# Patient Record
Sex: Male | Born: 1965 | Race: White | Hispanic: No | Marital: Married | Smoking: Current every day smoker
Health system: Southern US, Community
[De-identification: ages and names within clinical notes are randomized; demographics above are authoritative.]

## PROBLEM LIST (undated history)

## (undated) DIAGNOSIS — R202 Paresthesia of skin: Secondary | ICD-10-CM

## (undated) DIAGNOSIS — R262 Difficulty in walking, not elsewhere classified: Secondary | ICD-10-CM

## (undated) DIAGNOSIS — R200 Anesthesia of skin: Secondary | ICD-10-CM

## (undated) DIAGNOSIS — F419 Anxiety disorder, unspecified: Secondary | ICD-10-CM

## (undated) DIAGNOSIS — R531 Weakness: Secondary | ICD-10-CM

## (undated) DIAGNOSIS — F329 Major depressive disorder, single episode, unspecified: Secondary | ICD-10-CM

## (undated) DIAGNOSIS — F32A Depression, unspecified: Secondary | ICD-10-CM

## (undated) HISTORY — DX: Depression, unspecified: F32.A

## (undated) HISTORY — DX: Weakness: R53.1

## (undated) HISTORY — DX: Anxiety disorder, unspecified: F41.9

## (undated) HISTORY — DX: Anesthesia of skin: R20.0

## (undated) HISTORY — DX: Major depressive disorder, single episode, unspecified: F32.9

## (undated) HISTORY — DX: Paresthesia of skin: R20.2

## (undated) HISTORY — DX: Difficulty in walking, not elsewhere classified: R26.2

## (undated) HISTORY — PX: OTHER SURGICAL HISTORY: SHX169

---

## 2002-11-28 ENCOUNTER — Encounter: Payer: Self-pay | Admitting: Emergency Medicine

## 2002-11-28 ENCOUNTER — Emergency Department (HOSPITAL_COMMUNITY): Admission: EM | Admit: 2002-11-28 | Discharge: 2002-11-28 | Payer: Self-pay | Admitting: Emergency Medicine

## 2004-06-15 ENCOUNTER — Encounter: Admission: RE | Admit: 2004-06-15 | Discharge: 2004-06-15 | Payer: Self-pay | Admitting: Family Medicine

## 2004-07-05 ENCOUNTER — Encounter: Admission: RE | Admit: 2004-07-05 | Discharge: 2004-07-05 | Payer: Self-pay | Admitting: Family Medicine

## 2004-07-22 ENCOUNTER — Encounter: Admission: RE | Admit: 2004-07-22 | Discharge: 2004-07-22 | Payer: Self-pay | Admitting: Family Medicine

## 2004-08-02 ENCOUNTER — Encounter: Admission: RE | Admit: 2004-08-02 | Discharge: 2004-08-02 | Payer: Self-pay | Admitting: Family Medicine

## 2006-06-21 ENCOUNTER — Emergency Department (HOSPITAL_COMMUNITY): Admission: EM | Admit: 2006-06-21 | Discharge: 2006-06-21 | Payer: Self-pay | Admitting: Emergency Medicine

## 2008-01-23 ENCOUNTER — Ambulatory Visit: Payer: Self-pay | Admitting: Cardiovascular Disease

## 2008-01-27 ENCOUNTER — Ambulatory Visit: Payer: Self-pay

## 2008-11-09 ENCOUNTER — Emergency Department (HOSPITAL_COMMUNITY): Admission: EM | Admit: 2008-11-09 | Discharge: 2008-11-09 | Payer: Self-pay | Admitting: Emergency Medicine

## 2008-11-11 ENCOUNTER — Emergency Department (HOSPITAL_COMMUNITY): Admission: EM | Admit: 2008-11-11 | Discharge: 2008-11-11 | Payer: Self-pay | Admitting: Internal Medicine

## 2008-11-12 ENCOUNTER — Encounter: Payer: Self-pay | Admitting: Emergency Medicine

## 2008-11-12 ENCOUNTER — Ambulatory Visit: Payer: Self-pay | Admitting: Internal Medicine

## 2008-11-12 ENCOUNTER — Ambulatory Visit: Payer: Self-pay | Admitting: Critical Care Medicine

## 2008-11-12 ENCOUNTER — Inpatient Hospital Stay (HOSPITAL_COMMUNITY): Admission: EM | Admit: 2008-11-12 | Discharge: 2008-12-01 | Payer: Self-pay | Admitting: Neurology

## 2008-11-17 ENCOUNTER — Encounter: Payer: Self-pay | Admitting: Emergency Medicine

## 2008-11-19 ENCOUNTER — Ambulatory Visit: Payer: Self-pay | Admitting: Internal Medicine

## 2008-11-20 ENCOUNTER — Encounter (INDEPENDENT_AMBULATORY_CARE_PROVIDER_SITE_OTHER): Payer: Self-pay | Admitting: Neurology

## 2008-11-20 ENCOUNTER — Ambulatory Visit: Payer: Self-pay | Admitting: Infectious Diseases

## 2008-11-24 ENCOUNTER — Encounter: Payer: Self-pay | Admitting: Internal Medicine

## 2008-12-01 ENCOUNTER — Ambulatory Visit: Payer: Self-pay | Admitting: Pulmonary Disease

## 2008-12-01 ENCOUNTER — Inpatient Hospital Stay: Admission: RE | Admit: 2008-12-01 | Discharge: 2009-03-01 | Payer: Self-pay | Admitting: Internal Medicine

## 2008-12-14 ENCOUNTER — Encounter: Payer: Self-pay | Admitting: Critical Care Medicine

## 2008-12-30 ENCOUNTER — Ambulatory Visit: Payer: Self-pay | Admitting: Pulmonary Disease

## 2009-01-27 ENCOUNTER — Ambulatory Visit: Payer: Self-pay | Admitting: Pulmonary Disease

## 2009-03-17 ENCOUNTER — Inpatient Hospital Stay (HOSPITAL_COMMUNITY): Admission: EM | Admit: 2009-03-17 | Discharge: 2009-03-23 | Payer: Self-pay | Admitting: Emergency Medicine

## 2009-03-19 ENCOUNTER — Ambulatory Visit: Payer: Self-pay | Admitting: Physical Medicine & Rehabilitation

## 2009-03-23 ENCOUNTER — Inpatient Hospital Stay (HOSPITAL_COMMUNITY)
Admission: RE | Admit: 2009-03-23 | Discharge: 2009-04-27 | Payer: Self-pay | Admitting: Physical Medicine & Rehabilitation

## 2009-03-23 ENCOUNTER — Ambulatory Visit: Payer: Self-pay | Admitting: Physical Medicine & Rehabilitation

## 2009-04-20 ENCOUNTER — Ambulatory Visit: Payer: Self-pay | Admitting: Psychology

## 2009-04-24 ENCOUNTER — Ambulatory Visit: Payer: Self-pay | Admitting: Physical Medicine & Rehabilitation

## 2009-05-25 ENCOUNTER — Encounter
Admission: RE | Admit: 2009-05-25 | Discharge: 2009-07-01 | Payer: Self-pay | Admitting: Physical Medicine & Rehabilitation

## 2009-05-26 ENCOUNTER — Ambulatory Visit: Payer: Self-pay | Admitting: Physical Medicine & Rehabilitation

## 2009-06-22 ENCOUNTER — Encounter: Admission: RE | Admit: 2009-06-22 | Discharge: 2009-06-22 | Payer: Self-pay | Admitting: Internal Medicine

## 2009-06-28 ENCOUNTER — Ambulatory Visit: Payer: Self-pay | Admitting: Physical Medicine & Rehabilitation

## 2009-07-13 ENCOUNTER — Encounter
Admission: RE | Admit: 2009-07-13 | Discharge: 2009-10-11 | Payer: Self-pay | Admitting: Physical Medicine & Rehabilitation

## 2009-07-27 ENCOUNTER — Encounter
Admission: RE | Admit: 2009-07-27 | Discharge: 2009-10-25 | Payer: Self-pay | Admitting: Physical Medicine & Rehabilitation

## 2009-09-28 ENCOUNTER — Ambulatory Visit: Payer: Self-pay | Admitting: Physical Medicine & Rehabilitation

## 2009-10-25 ENCOUNTER — Encounter
Admission: RE | Admit: 2009-10-25 | Discharge: 2010-01-23 | Payer: Self-pay | Admitting: Physical Medicine & Rehabilitation

## 2009-10-28 ENCOUNTER — Ambulatory Visit: Payer: Self-pay | Admitting: Physical Medicine & Rehabilitation

## 2009-11-25 ENCOUNTER — Ambulatory Visit: Payer: Self-pay | Admitting: Physical Medicine & Rehabilitation

## 2010-01-25 ENCOUNTER — Encounter
Admission: RE | Admit: 2010-01-25 | Discharge: 2010-04-25 | Payer: Self-pay | Admitting: Physical Medicine & Rehabilitation

## 2010-01-26 ENCOUNTER — Telehealth (INDEPENDENT_AMBULATORY_CARE_PROVIDER_SITE_OTHER): Payer: Self-pay | Admitting: *Deleted

## 2010-01-28 ENCOUNTER — Ambulatory Visit (HOSPITAL_COMMUNITY): Admission: RE | Admit: 2010-01-28 | Discharge: 2010-01-28 | Payer: Self-pay | Admitting: Orthopedic Surgery

## 2010-02-02 ENCOUNTER — Ambulatory Visit: Payer: Self-pay | Admitting: Physical Medicine & Rehabilitation

## 2010-03-03 ENCOUNTER — Ambulatory Visit: Payer: Self-pay | Admitting: Physical Medicine & Rehabilitation

## 2010-04-05 ENCOUNTER — Ambulatory Visit: Payer: Self-pay | Admitting: Physical Medicine & Rehabilitation

## 2010-05-20 ENCOUNTER — Encounter
Admission: RE | Admit: 2010-05-20 | Discharge: 2010-06-23 | Payer: Self-pay | Source: Home / Self Care | Attending: Physical Medicine & Rehabilitation | Admitting: Physical Medicine & Rehabilitation

## 2010-05-30 ENCOUNTER — Ambulatory Visit: Payer: Self-pay | Admitting: Physical Medicine & Rehabilitation

## 2010-06-23 ENCOUNTER — Ambulatory Visit: Payer: Self-pay | Admitting: Physical Medicine & Rehabilitation

## 2010-07-19 ENCOUNTER — Encounter
Admission: RE | Admit: 2010-07-19 | Discharge: 2010-07-21 | Payer: Self-pay | Source: Home / Self Care | Attending: Physical Medicine & Rehabilitation | Admitting: Physical Medicine & Rehabilitation

## 2010-07-21 ENCOUNTER — Ambulatory Visit
Admission: RE | Admit: 2010-07-21 | Discharge: 2010-07-21 | Payer: Self-pay | Source: Home / Self Care | Attending: Physical Medicine & Rehabilitation | Admitting: Physical Medicine & Rehabilitation

## 2010-08-09 NOTE — Progress Notes (Signed)
  Phone Note From Other Clinic   Caller: Janice/[facility] Initial call taken by: Colonel Bald to [phone] [facility]  January 26, 2010 3:35 PM

## 2010-09-16 ENCOUNTER — Encounter: Payer: Self-pay | Attending: Physical Medicine & Rehabilitation

## 2010-09-16 ENCOUNTER — Ambulatory Visit (HOSPITAL_BASED_OUTPATIENT_CLINIC_OR_DEPARTMENT_OTHER): Payer: Self-pay | Admitting: Physical Medicine & Rehabilitation

## 2010-09-16 DIAGNOSIS — G8250 Quadriplegia, unspecified: Secondary | ICD-10-CM

## 2010-09-16 DIAGNOSIS — G610 Guillain-Barre syndrome: Secondary | ICD-10-CM | POA: Insufficient documentation

## 2010-09-16 DIAGNOSIS — IMO0002 Reserved for concepts with insufficient information to code with codable children: Secondary | ICD-10-CM

## 2010-09-16 DIAGNOSIS — G894 Chronic pain syndrome: Secondary | ICD-10-CM | POA: Insufficient documentation

## 2010-09-16 DIAGNOSIS — F329 Major depressive disorder, single episode, unspecified: Secondary | ICD-10-CM

## 2010-09-24 LAB — CBC
MCH: 29.5 pg (ref 26.0–34.0)
MCHC: 34 g/dL (ref 30.0–36.0)
Platelets: 173 10*3/uL (ref 150–400)
RBC: 5.51 MIL/uL (ref 4.22–5.81)

## 2010-09-24 LAB — COMPREHENSIVE METABOLIC PANEL
AST: 21 U/L (ref 0–37)
Albumin: 4.3 g/dL (ref 3.5–5.2)
Calcium: 9.6 mg/dL (ref 8.4–10.5)
Creatinine, Ser: 0.87 mg/dL (ref 0.4–1.5)
GFR calc Af Amer: >60 mL/min (ref >60)
Sodium: 136 mEq/L (ref 135–145)

## 2010-10-13 LAB — URINE CULTURE: Colony Count: >=100000

## 2010-10-13 LAB — URINALYSIS, ROUTINE W REFLEX MICROSCOPIC
Bilirubin Urine: NEGATIVE
Specific Gravity, Urine: 1.026 (ref 1.005–1.030)
pH: 7 (ref 5.0–8.0)

## 2010-10-13 LAB — CBC
HCT: 36.3 % — ABNORMAL LOW (ref 39.0–52.0)
MCV: 86.8 fL (ref 78.0–100.0)
Platelets: 165 10*3/uL (ref 150–400)
RDW: 14.4 % (ref 11.5–15.5)

## 2010-10-13 LAB — URINE MICROSCOPIC-ADD ON

## 2010-10-13 LAB — BASIC METABOLIC PANEL
CO2: 27 mEq/L (ref 19–32)
Calcium: 9.7 mg/dL (ref 8.4–10.5)
Glucose, Bld: 150 mg/dL — ABNORMAL HIGH (ref 70–99)
Potassium: 4.2 mEq/L (ref 3.5–5.1)
Sodium: 140 mEq/L (ref 135–145)

## 2010-10-14 LAB — DIFFERENTIAL
Basophils Absolute: 0 10*3/uL (ref 0.0–0.1)
Basophils Absolute: 0 10*3/uL (ref 0.0–0.1)
Basophils Relative: 0 % (ref 0–1)
Eosinophils Absolute: 0.6 10*3/uL (ref 0.0–0.7)
Eosinophils Absolute: 0.6 10*3/uL (ref 0.0–0.7)
Eosinophils Absolute: 0.6 10*3/uL (ref 0.0–0.7)
Eosinophils Relative: 8 % — ABNORMAL HIGH (ref 0–5)
Eosinophils Relative: 8 % — ABNORMAL HIGH (ref 0–5)
Eosinophils Relative: 9 % — ABNORMAL HIGH (ref 0–5)
Lymphocytes Relative: 23 % (ref 12–46)
Lymphocytes Relative: 28 % (ref 12–46)
Lymphs Abs: 1.3 10*3/uL (ref 0.7–4.0)
Lymphs Abs: 2 10*3/uL (ref 0.7–4.0)
Monocytes Absolute: 0.3 10*3/uL (ref 0.1–1.0)
Monocytes Absolute: 0.4 10*3/uL (ref 0.1–1.0)
Monocytes Absolute: 0.5 10*3/uL (ref 0.1–1.0)
Monocytes Relative: 4 % (ref 3–12)

## 2010-10-14 LAB — CBC
HCT: 35.3 % — ABNORMAL LOW (ref 39.0–52.0)
Hemoglobin: 11.7 g/dL — ABNORMAL LOW (ref 13.0–17.0)
Hemoglobin: 13.5 g/dL (ref 13.0–17.0)
MCHC: 33.8 g/dL (ref 30.0–36.0)
MCHC: 33.2 g/dL (ref 30.0–36.0)
MCHC: 33.7 g/dL (ref 30.0–36.0)
MCV: 87.7 fL (ref 78.0–100.0)
Platelets: 194 10*3/uL (ref 150–400)
Platelets: 232 10*3/uL (ref 150–400)
RBC: 4.6 MIL/uL (ref 4.22–5.81)
RBC: 4.45 MIL/uL (ref 4.22–5.81)
RBC: 4.57 MIL/uL (ref 4.22–5.81)
RDW: 14.5 % (ref 11.5–15.5)
WBC: 7.8 10*3/uL (ref 4.0–10.5)
WBC: 5.2 10*3/uL (ref 4.0–10.5)
WBC: 7 10*3/uL (ref 4.0–10.5)

## 2010-10-14 LAB — URINALYSIS, ROUTINE W REFLEX MICROSCOPIC
Bilirubin Urine: NEGATIVE
Glucose, UA: NEGATIVE mg/dL
Ketones, ur: NEGATIVE mg/dL
Protein, ur: 30 mg/dL — AB

## 2010-10-14 LAB — URINE MICROSCOPIC-ADD ON

## 2010-10-14 LAB — URINE CULTURE: Culture: NO GROWTH

## 2010-10-14 LAB — COMPREHENSIVE METABOLIC PANEL
ALT: 13 U/L (ref 0–53)
ALT: 12 U/L (ref 0–53)
AST: 19 U/L (ref 0–37)
AST: 16 U/L (ref 0–37)
Albumin: 3.5 g/dL (ref 3.5–5.2)
Albumin: 3.4 g/dL — ABNORMAL LOW (ref 3.5–5.2)
CO2: 25 mEq/L (ref 19–32)
CO2: 32 mEq/L (ref 19–32)
Calcium: 9.5 mg/dL (ref 8.4–10.5)
Chloride: 101 mEq/L (ref 96–112)
GFR calc Af Amer: >60 mL/min (ref >60)
GFR calc Af Amer: >60 mL/min (ref >60)
GFR calc non Af Amer: >60 mL/min (ref >60)
GFR calc non Af Amer: >60 mL/min (ref >60)
Sodium: 139 mEq/L (ref 135–145)
Sodium: 139 mEq/L (ref 135–145)
Total Bilirubin: 0.4 mg/dL (ref 0.3–1.2)

## 2010-10-14 LAB — BASIC METABOLIC PANEL
BUN: 6 mg/dL (ref 6–23)
BUN: 7 mg/dL (ref 6–23)
CO2: 32 mEq/L (ref 19–32)
Calcium: 9 mg/dL (ref 8.4–10.5)
Calcium: 9.1 mg/dL (ref 8.4–10.5)
Chloride: 102 mEq/L (ref 96–112)
Creatinine, Ser: 0.54 mg/dL (ref 0.4–1.5)
Creatinine, Ser: 0.71 mg/dL (ref 0.4–1.5)
GFR calc Af Amer: >60 mL/min (ref >60)
GFR calc Af Amer: >60 mL/min (ref >60)
GFR calc non Af Amer: >60 mL/min (ref >60)
GFR calc non Af Amer: >60 mL/min (ref >60)
Glucose, Bld: 120 mg/dL — ABNORMAL HIGH (ref 70–99)
Potassium: 3.8 mEq/L (ref 3.5–5.1)
Sodium: 140 mEq/L (ref 135–145)
Sodium: 142 mEq/L (ref 135–145)

## 2010-10-14 LAB — GLUCOSE, CAPILLARY: Glucose-Capillary: 76 mg/dL (ref 70–99)

## 2010-10-14 LAB — CULTURE, BLOOD (ROUTINE X 2): Culture: NO GROWTH

## 2010-10-14 LAB — LACTIC ACID, PLASMA: Lactic Acid, Venous: 1 mmol/L (ref 0.5–2.2)

## 2010-10-15 LAB — DIFFERENTIAL
Basophils Absolute: 0.1 10*3/uL (ref 0.0–0.1)
Eosinophils Absolute: 0.2 10*3/uL (ref 0.0–0.7)
Eosinophils Relative: 1 % (ref 0–5)
Lymphocytes Relative: 7 % — ABNORMAL LOW (ref 12–46)
Monocytes Absolute: 1 10*3/uL (ref 0.1–1.0)

## 2010-10-15 LAB — BASIC METABOLIC PANEL
BUN: 6 mg/dL (ref 6–23)
CO2: 34 mEq/L — ABNORMAL HIGH (ref 19–32)
Calcium: 9.4 mg/dL (ref 8.4–10.5)
Calcium: 9 mg/dL (ref 8.4–10.5)
Calcium: 9 mg/dL (ref 8.4–10.5)
Chloride: 100 mEq/L (ref 96–112)
Creatinine, Ser: 0.46 mg/dL (ref 0.4–1.5)
Creatinine, Ser: 0.31 mg/dL — ABNORMAL LOW (ref 0.4–1.5)
GFR calc Af Amer: >60 mL/min (ref >60)
GFR calc Af Amer: >60 mL/min (ref >60)
GFR calc Af Amer: >60 mL/min (ref >60)
GFR calc non Af Amer: >60 mL/min (ref >60)
GFR calc non Af Amer: >60 mL/min (ref >60)
Glucose, Bld: 135 mg/dL — ABNORMAL HIGH (ref 70–99)
Potassium: 3.6 mEq/L (ref 3.5–5.1)
Sodium: 136 mEq/L (ref 135–145)
Sodium: 139 mEq/L (ref 135–145)

## 2010-10-15 LAB — COMPREHENSIVE METABOLIC PANEL
ALT: 28 U/L (ref 0–53)
AST: 29 U/L (ref 0–37)
CO2: 31 mEq/L (ref 19–32)
Chloride: 99 mEq/L (ref 96–112)
Creatinine, Ser: 0.46 mg/dL (ref 0.4–1.5)
GFR calc Af Amer: >60 mL/min (ref >60)
GFR calc non Af Amer: >60 mL/min (ref >60)
Glucose, Bld: 143 mg/dL — ABNORMAL HIGH (ref 70–99)
Total Bilirubin: 0.5 mg/dL (ref 0.3–1.2)

## 2010-10-15 LAB — URINALYSIS, ROUTINE W REFLEX MICROSCOPIC
Glucose, UA: NEGATIVE mg/dL
Hgb urine dipstick: NEGATIVE
Protein, ur: NEGATIVE mg/dL
Specific Gravity, Urine: 1.02 (ref 1.005–1.030)
pH: 6.5 (ref 5.0–8.0)

## 2010-10-15 LAB — CULTURE, BLOOD (ROUTINE X 2): Culture: NO GROWTH

## 2010-10-15 LAB — URINE CULTURE
Colony Count: >=100000
Colony Count: >=100000

## 2010-10-15 LAB — CBC
HCT: 31.1 % — ABNORMAL LOW (ref 39.0–52.0)
HCT: 33.7 % — ABNORMAL LOW (ref 39.0–52.0)
Hemoglobin: 10.7 g/dL — ABNORMAL LOW (ref 13.0–17.0)
Hemoglobin: 11.8 g/dL — ABNORMAL LOW (ref 13.0–17.0)
MCHC: 34.2 g/dL (ref 30.0–36.0)
MCHC: 34.5 g/dL (ref 30.0–36.0)
MCV: 89.9 fL (ref 78.0–100.0)
MCV: 91.1 fL (ref 78.0–100.0)
Platelets: 257 10*3/uL (ref 150–400)
RBC: 3.75 MIL/uL — ABNORMAL LOW (ref 4.22–5.81)
RDW: 15.1 % (ref 11.5–15.5)
RDW: 15.8 % — ABNORMAL HIGH (ref 11.5–15.5)
WBC: 10 10*3/uL (ref 4.0–10.5)

## 2010-10-15 LAB — URINALYSIS, MICROSCOPIC ONLY
Nitrite: POSITIVE — AB
Specific Gravity, Urine: 1.015 (ref 1.005–1.030)
Urobilinogen, UA: 1 mg/dL (ref 0.0–1.0)
pH: 6.5 (ref 5.0–8.0)

## 2010-10-15 LAB — URINE MICROSCOPIC-ADD ON

## 2010-10-16 LAB — WOUND CULTURE

## 2010-10-16 LAB — BASIC METABOLIC PANEL
BUN: 11 mg/dL (ref 6–23)
BUN: 12 mg/dL (ref 6–23)
BUN: 16 mg/dL (ref 6–23)
CO2: 31 mEq/L (ref 19–32)
CO2: 36 mEq/L — ABNORMAL HIGH (ref 19–32)
Calcium: 9.7 mg/dL (ref 8.4–10.5)
Calcium: 9 mg/dL (ref 8.4–10.5)
Calcium: 9.2 mg/dL (ref 8.4–10.5)
Chloride: 98 mEq/L (ref 96–112)
Creatinine, Ser: <0.3 mg/dL — ABNORMAL LOW (ref 0.4–1.5)
GFR calc Af Amer: >60 mL/min (ref >60)
GFR calc non Af Amer: >60 mL/min (ref >60)
GFR calc non Af Amer: >60 mL/min (ref >60)
Glucose, Bld: 116 mg/dL — ABNORMAL HIGH (ref 70–99)
Glucose, Bld: 149 mg/dL — ABNORMAL HIGH (ref 70–99)
Glucose, Bld: 131 mg/dL — ABNORMAL HIGH (ref 70–99)
Potassium: 3.7 mEq/L (ref 3.5–5.1)
Potassium: 3.8 mEq/L (ref 3.5–5.1)
Sodium: 139 mEq/L (ref 135–145)
Sodium: 137 mEq/L (ref 135–145)

## 2010-10-16 LAB — CBC
HCT: 29.4 % — ABNORMAL LOW (ref 39.0–52.0)
Hemoglobin: 10.2 g/dL — ABNORMAL LOW (ref 13.0–17.0)
Hemoglobin: 12 g/dL — ABNORMAL LOW (ref 13.0–17.0)
MCHC: 34.4 g/dL (ref 30.0–36.0)
Platelets: 217 10*3/uL (ref 150–400)
Platelets: 232 10*3/uL (ref 150–400)
Platelets: 234 10*3/uL (ref 150–400)
RBC: 3.29 MIL/uL — ABNORMAL LOW (ref 4.22–5.81)
RBC: 3.91 MIL/uL — ABNORMAL LOW (ref 4.22–5.81)
RBC: 3.93 MIL/uL — ABNORMAL LOW (ref 4.22–5.81)
RDW: 16.4 % — ABNORMAL HIGH (ref 11.5–15.5)
RDW: 17.5 % — ABNORMAL HIGH (ref 11.5–15.5)
RDW: 17.5 % — ABNORMAL HIGH (ref 11.5–15.5)
WBC: 8.1 10*3/uL (ref 4.0–10.5)

## 2010-10-16 LAB — COMPREHENSIVE METABOLIC PANEL
ALT: 78 U/L — ABNORMAL HIGH (ref 0–53)
AST: 38 U/L — ABNORMAL HIGH (ref 0–37)
Alkaline Phosphatase: 120 U/L — ABNORMAL HIGH (ref 39–117)
CO2: 37 mEq/L — ABNORMAL HIGH (ref 19–32)
Glucose, Bld: 154 mg/dL — ABNORMAL HIGH (ref 70–99)
Potassium: 4.1 mEq/L (ref 3.5–5.1)
Sodium: 140 mEq/L (ref 135–145)
Total Protein: 6.3 g/dL (ref 6.0–8.3)

## 2010-10-16 LAB — ANAEROBIC CULTURE

## 2010-10-16 LAB — CLOSTRIDIUM DIFFICILE EIA
C difficile Toxins A+B, EIA: NEGATIVE
C difficile Toxins A+B, EIA: NEGATIVE

## 2010-10-16 LAB — VANCOMYCIN, TROUGH: Vancomycin Tr: 8.6 ug/mL — ABNORMAL LOW (ref 10.0–20.0)

## 2010-10-17 LAB — COMPREHENSIVE METABOLIC PANEL
ALT: 35 U/L (ref 0–53)
ALT: 38 U/L (ref 0–53)
AST: 27 U/L (ref 0–37)
AST: 27 U/L (ref 0–37)
Albumin: 2.4 g/dL — ABNORMAL LOW (ref 3.5–5.2)
Alkaline Phosphatase: 125 U/L — ABNORMAL HIGH (ref 39–117)
CO2: 34 mEq/L — ABNORMAL HIGH (ref 19–32)
CO2: 38 mEq/L — ABNORMAL HIGH (ref 19–32)
Calcium: 8.7 mg/dL (ref 8.4–10.5)
Creatinine, Ser: 0.36 mg/dL — ABNORMAL LOW (ref 0.4–1.5)
GFR calc Af Amer: >60 mL/min (ref >60)
GFR calc Af Amer: >60 mL/min (ref >60)
GFR calc non Af Amer: >60 mL/min (ref >60)
GFR calc non Af Amer: >60 mL/min (ref >60)
Glucose, Bld: 134 mg/dL — ABNORMAL HIGH (ref 70–99)
Potassium: 4.1 mEq/L (ref 3.5–5.1)
Sodium: 137 mEq/L (ref 135–145)
Sodium: 137 mEq/L (ref 135–145)
Total Protein: 6 g/dL (ref 6.0–8.3)

## 2010-10-17 LAB — BODY FLUID CELL COUNT WITH DIFFERENTIAL: Total Nucleated Cell Count, Fluid: 12900 cu mm — ABNORMAL HIGH (ref 0–1000)

## 2010-10-17 LAB — CULTURE, BAL-QUANTITATIVE W GRAM STAIN: Colony Count: 80000

## 2010-10-17 LAB — BASIC METABOLIC PANEL
BUN: 20 mg/dL (ref 6–23)
BUN: 21 mg/dL (ref 6–23)
CO2: 38 mEq/L — ABNORMAL HIGH (ref 19–32)
Calcium: 8.7 mg/dL (ref 8.4–10.5)
Calcium: 8.6 mg/dL (ref 8.4–10.5)
Chloride: 96 mEq/L (ref 96–112)
Chloride: 99 mEq/L (ref 96–112)
Chloride: 92 mEq/L — ABNORMAL LOW (ref 96–112)
Chloride: 95 mEq/L — ABNORMAL LOW (ref 96–112)
Creatinine, Ser: 0.45 mg/dL (ref 0.4–1.5)
Creatinine, Ser: 0.46 mg/dL (ref 0.4–1.5)
GFR calc non Af Amer: >60 mL/min (ref >60)
GFR calc non Af Amer: >60 mL/min (ref >60)
GFR calc non Af Amer: >60 mL/min (ref >60)
Glucose, Bld: 141 mg/dL — ABNORMAL HIGH (ref 70–99)
Glucose, Bld: 167 mg/dL — ABNORMAL HIGH (ref 70–99)
Glucose, Bld: 168 mg/dL — ABNORMAL HIGH (ref 70–99)
Glucose, Bld: 119 mg/dL — ABNORMAL HIGH (ref 70–99)
Potassium: 4 mEq/L (ref 3.5–5.1)
Potassium: 4 mEq/L (ref 3.5–5.1)
Potassium: 4.3 mEq/L (ref 3.5–5.1)
Potassium: 3.7 mEq/L (ref 3.5–5.1)
Potassium: 4.4 mEq/L (ref 3.5–5.1)
Sodium: 137 mEq/L (ref 135–145)
Sodium: 140 mEq/L (ref 135–145)
Sodium: 143 mEq/L (ref 135–145)
Sodium: 135 mEq/L (ref 135–145)

## 2010-10-17 LAB — DIFFERENTIAL
Eosinophils Relative: 1 % (ref 0–5)
Lymphocytes Relative: 5 % — ABNORMAL LOW (ref 12–46)
Lymphs Abs: 0.6 10*3/uL — ABNORMAL LOW (ref 0.7–4.0)

## 2010-10-17 LAB — CULTURE, RESPIRATORY W GRAM STAIN

## 2010-10-17 LAB — WOUND CULTURE: Gram Stain: NONE SEEN

## 2010-10-17 LAB — VANCOMYCIN, TROUGH
Vancomycin Tr: 8.5 ug/mL — ABNORMAL LOW (ref 10.0–20.0)
Vancomycin Tr: 6.2 ug/mL — ABNORMAL LOW (ref 10.0–20.0)

## 2010-10-17 LAB — BLOOD GAS, ARTERIAL
Acid-Base Excess: 10.4 mmol/L — ABNORMAL HIGH (ref 0.0–2.0)
Bicarbonate: 36.3 mEq/L — ABNORMAL HIGH (ref 20.0–24.0)
FIO2: 1 %
FIO2: 50 %
RATE: 14 resp/min
TCO2: 37.6 mmol/L (ref 0–100)
TCO2: 38 mmol/L (ref 0–100)
pCO2 arterial: 56.2 mmHg — ABNORMAL HIGH (ref 35.0–45.0)
pH, Arterial: 7.385 (ref 7.350–7.450)
pH, Arterial: 7.431 (ref 7.350–7.450)
pO2, Arterial: 232 mmHg — ABNORMAL HIGH (ref 80.0–100.0)

## 2010-10-17 LAB — CBC
HCT: 29.3 % — ABNORMAL LOW (ref 39.0–52.0)
HCT: 31 % — ABNORMAL LOW (ref 39.0–52.0)
HCT: 32.4 % — ABNORMAL LOW (ref 39.0–52.0)
HCT: 35.2 % — ABNORMAL LOW (ref 39.0–52.0)
HCT: 35.6 % — ABNORMAL LOW (ref 39.0–52.0)
HCT: 39.5 % (ref 39.0–52.0)
HCT: 42.6 % (ref 39.0–52.0)
Hemoglobin: 10 g/dL — ABNORMAL LOW (ref 13.0–17.0)
Hemoglobin: 10.4 g/dL — ABNORMAL LOW (ref 13.0–17.0)
Hemoglobin: 10.7 g/dL — ABNORMAL LOW (ref 13.0–17.0)
Hemoglobin: 11.8 g/dL — ABNORMAL LOW (ref 13.0–17.0)
Hemoglobin: 12.5 g/dL — ABNORMAL LOW (ref 13.0–17.0)
MCHC: 33.6 g/dL (ref 30.0–36.0)
MCHC: 34.2 g/dL (ref 30.0–36.0)
MCV: 89.8 fL (ref 78.0–100.0)
MCV: 90.5 fL (ref 78.0–100.0)
MCV: 88 fL (ref 78.0–100.0)
MCV: 88.3 fL (ref 78.0–100.0)
Platelets: 151 10*3/uL (ref 150–400)
Platelets: 176 10*3/uL (ref 150–400)
Platelets: 190 10*3/uL (ref 150–400)
Platelets: 148 10*3/uL — ABNORMAL LOW (ref 150–400)
Platelets: 150 10*3/uL (ref 150–400)
Platelets: 175 10*3/uL (ref 150–400)
RBC: 3.46 MIL/uL — ABNORMAL LOW (ref 4.22–5.81)
RBC: 3.98 MIL/uL — ABNORMAL LOW (ref 4.22–5.81)
RBC: 4.14 MIL/uL — ABNORMAL LOW (ref 4.22–5.81)
RBC: 4.84 MIL/uL (ref 4.22–5.81)
RDW: 15.4 % (ref 11.5–15.5)
RDW: 15.8 % — ABNORMAL HIGH (ref 11.5–15.5)
RDW: 17.5 % — ABNORMAL HIGH (ref 11.5–15.5)
RDW: 14.3 % (ref 11.5–15.5)
WBC: 10.2 10*3/uL (ref 4.0–10.5)
WBC: 12.4 10*3/uL — ABNORMAL HIGH (ref 4.0–10.5)
WBC: 17.4 10*3/uL — ABNORMAL HIGH (ref 4.0–10.5)
WBC: 7.7 10*3/uL (ref 4.0–10.5)
WBC: 8.5 10*3/uL (ref 4.0–10.5)
WBC: 9.8 10*3/uL (ref 4.0–10.5)
WBC: 10.9 10*3/uL — ABNORMAL HIGH (ref 4.0–10.5)
WBC: 11 10*3/uL — ABNORMAL HIGH (ref 4.0–10.5)

## 2010-10-17 LAB — AFB CULTURE WITH SMEAR (NOT AT ARMC): Acid Fast Smear: NONE SEEN

## 2010-10-17 LAB — CK TOTAL AND CKMB (NOT AT ARMC)
CK, MB: 1.6 ng/mL (ref 0.3–4.0)
Relative Index: INVALID (ref 0.0–2.5)

## 2010-10-17 LAB — FUNGUS CULTURE W SMEAR: Fungal Smear: NONE SEEN

## 2010-10-17 LAB — CULTURE, BLOOD (ROUTINE X 2): Culture: NO GROWTH

## 2010-10-17 LAB — PATHOLOGIST SMEAR REVIEW

## 2010-10-18 LAB — CARDIAC PANEL(CRET KIN+CKTOT+MB+TROPI)
CK, MB: 1.4 ng/mL (ref 0.3–4.0)
CK, MB: 3.4 ng/mL (ref 0.3–4.0)
Relative Index: 0.7 (ref 0.0–2.5)
Relative Index: 3.1 — ABNORMAL HIGH (ref 0.0–2.5)
Total CK: 143 U/L (ref 7–232)
Total CK: 201 U/L (ref 7–232)
Total CK: 323 U/L — ABNORMAL HIGH (ref 7–232)
Troponin I: 0.06 ng/mL (ref 0.00–0.06)
Troponin I: 0.09 ng/mL — ABNORMAL HIGH (ref 0.00–0.06)
Troponin I: 0.1 ng/mL — ABNORMAL HIGH (ref 0.00–0.06)

## 2010-10-18 LAB — POCT I-STAT, CHEM 8
BUN: 11 mg/dL (ref 6–23)
Calcium, Ion: 1.09 mmol/L — ABNORMAL LOW (ref 1.12–1.32)
Chloride: 104 mEq/L (ref 96–112)
Potassium: 3.9 mEq/L (ref 3.5–5.1)
Sodium: 138 mEq/L (ref 135–145)

## 2010-10-18 LAB — BODY FLUID CELL COUNT WITH DIFFERENTIAL
Lymphs, Fluid: 2 %
Total Nucleated Cell Count, Fluid: 83000 cu mm — ABNORMAL HIGH (ref 0–1000)

## 2010-10-18 LAB — GLUCOSE, CAPILLARY
Glucose-Capillary: 109 mg/dL — ABNORMAL HIGH (ref 70–99)
Glucose-Capillary: 109 mg/dL — ABNORMAL HIGH (ref 70–99)
Glucose-Capillary: 110 mg/dL — ABNORMAL HIGH (ref 70–99)
Glucose-Capillary: 113 mg/dL — ABNORMAL HIGH (ref 70–99)
Glucose-Capillary: 114 mg/dL — ABNORMAL HIGH (ref 70–99)
Glucose-Capillary: 115 mg/dL — ABNORMAL HIGH (ref 70–99)
Glucose-Capillary: 116 mg/dL — ABNORMAL HIGH (ref 70–99)
Glucose-Capillary: 117 mg/dL — ABNORMAL HIGH (ref 70–99)
Glucose-Capillary: 121 mg/dL — ABNORMAL HIGH (ref 70–99)
Glucose-Capillary: 122 mg/dL — ABNORMAL HIGH (ref 70–99)
Glucose-Capillary: 125 mg/dL — ABNORMAL HIGH (ref 70–99)
Glucose-Capillary: 130 mg/dL — ABNORMAL HIGH (ref 70–99)
Glucose-Capillary: 130 mg/dL — ABNORMAL HIGH (ref 70–99)
Glucose-Capillary: 133 mg/dL — ABNORMAL HIGH (ref 70–99)
Glucose-Capillary: 133 mg/dL — ABNORMAL HIGH (ref 70–99)
Glucose-Capillary: 138 mg/dL — ABNORMAL HIGH (ref 70–99)
Glucose-Capillary: 143 mg/dL — ABNORMAL HIGH (ref 70–99)
Glucose-Capillary: 146 mg/dL — ABNORMAL HIGH (ref 70–99)
Glucose-Capillary: 148 mg/dL — ABNORMAL HIGH (ref 70–99)
Glucose-Capillary: 150 mg/dL — ABNORMAL HIGH (ref 70–99)
Glucose-Capillary: 150 mg/dL — ABNORMAL HIGH (ref 70–99)
Glucose-Capillary: 150 mg/dL — ABNORMAL HIGH (ref 70–99)
Glucose-Capillary: 158 mg/dL — ABNORMAL HIGH (ref 70–99)
Glucose-Capillary: 159 mg/dL — ABNORMAL HIGH (ref 70–99)
Glucose-Capillary: 161 mg/dL — ABNORMAL HIGH (ref 70–99)
Glucose-Capillary: 161 mg/dL — ABNORMAL HIGH (ref 70–99)
Glucose-Capillary: 171 mg/dL — ABNORMAL HIGH (ref 70–99)
Glucose-Capillary: 176 mg/dL — ABNORMAL HIGH (ref 70–99)
Glucose-Capillary: 96 mg/dL (ref 70–99)
Glucose-Capillary: 118 mg/dL — ABNORMAL HIGH (ref 70–99)
Glucose-Capillary: 124 mg/dL — ABNORMAL HIGH (ref 70–99)
Glucose-Capillary: 127 mg/dL — ABNORMAL HIGH (ref 70–99)
Glucose-Capillary: 127 mg/dL — ABNORMAL HIGH (ref 70–99)
Glucose-Capillary: 132 mg/dL — ABNORMAL HIGH (ref 70–99)
Glucose-Capillary: 133 mg/dL — ABNORMAL HIGH (ref 70–99)
Glucose-Capillary: 134 mg/dL — ABNORMAL HIGH (ref 70–99)
Glucose-Capillary: 138 mg/dL — ABNORMAL HIGH (ref 70–99)
Glucose-Capillary: 141 mg/dL — ABNORMAL HIGH (ref 70–99)
Glucose-Capillary: 143 mg/dL — ABNORMAL HIGH (ref 70–99)
Glucose-Capillary: 152 mg/dL — ABNORMAL HIGH (ref 70–99)
Glucose-Capillary: 152 mg/dL — ABNORMAL HIGH (ref 70–99)
Glucose-Capillary: 154 mg/dL — ABNORMAL HIGH (ref 70–99)
Glucose-Capillary: 155 mg/dL — ABNORMAL HIGH (ref 70–99)
Glucose-Capillary: 163 mg/dL — ABNORMAL HIGH (ref 70–99)
Glucose-Capillary: 179 mg/dL — ABNORMAL HIGH (ref 70–99)
Glucose-Capillary: 222 mg/dL — ABNORMAL HIGH (ref 70–99)

## 2010-10-18 LAB — CULTURE, BLOOD (ROUTINE X 2)
Culture: NO GROWTH
Culture: NO GROWTH

## 2010-10-18 LAB — BLOOD GAS, ARTERIAL
Acid-Base Excess: 15.5 mmol/L — ABNORMAL HIGH (ref 0.0–2.0)
Acid-Base Excess: 11.1 mmol/L — ABNORMAL HIGH (ref 0.0–2.0)
Acid-Base Excess: 6.3 mmol/L — ABNORMAL HIGH (ref 0.0–2.0)
Acid-base deficit: 0.4 mmol/L (ref 0.0–2.0)
Acid-base deficit: 2.3 mmol/L — ABNORMAL HIGH (ref 0.0–2.0)
Acid-base deficit: 4.4 mmol/L — ABNORMAL HIGH (ref 0.0–2.0)
Acid-base deficit: 9.1 mmol/L — ABNORMAL HIGH (ref 0.0–2.0)
Bicarbonate: 38.8 mEq/L — ABNORMAL HIGH (ref 20.0–24.0)
Bicarbonate: 39.8 mEq/L — ABNORMAL HIGH (ref 20.0–24.0)
Bicarbonate: 41.2 mEq/L — ABNORMAL HIGH (ref 20.0–24.0)
Bicarbonate: 24.9 mEq/L — ABNORMAL HIGH (ref 20.0–24.0)
Bicarbonate: 28.7 mEq/L — ABNORMAL HIGH (ref 20.0–24.0)
Bicarbonate: 30 mEq/L — ABNORMAL HIGH (ref 20.0–24.0)
Bicarbonate: 36.8 mEq/L — ABNORMAL HIGH (ref 20.0–24.0)
Drawn by: 280971
Drawn by: 30575
FIO2: 0.4 %
FIO2: 0.4 %
FIO2: 0.4 %
FIO2: 0.5 %
FIO2: 1 %
FIO2: 1 %
FIO2: 1 %
MECHVT: 400 mL
MECHVT: 600 mL
MECHVT: 600 mL
O2 Saturation: 98.3 %
O2 Saturation: 98.6 %
O2 Saturation: 92.6 %
O2 Saturation: 98.1 %
O2 Saturation: 98.7 %
O2 Saturation: 99.7 %
PEEP: 5 cmH2O
PEEP: 5 cmH2O
PEEP: 5 cmH2O
PEEP: 8 cmH2O
Patient temperature: 97.7
Patient temperature: 98.6
Patient temperature: 98.6
Patient temperature: 99.1
RATE: 14 resp/min
RATE: 15 resp/min
RATE: 15 resp/min
TCO2: 41.2 mmol/L (ref 0–100)
TCO2: 41.8 mmol/L (ref 0–100)
TCO2: 18.3 mmol/L (ref 0–100)
TCO2: 26.4 mmol/L (ref 0–100)
TCO2: 30.6 mmol/L (ref 0–100)
TCO2: 31.3 mmol/L (ref 0–100)
TCO2: 39.7 mmol/L (ref 0–100)
pCO2 arterial: 69.2 mmHg (ref 35.0–45.0)
pCO2 arterial: 135 mmHg (ref 35.0–45.0)
pCO2 arterial: 39.6 mmHg (ref 35.0–45.0)
pCO2 arterial: 41.2 mmHg (ref 35.0–45.0)
pCO2 arterial: 42.2 mmHg (ref 35.0–45.0)
pCO2 arterial: 66.8 mmHg (ref 35.0–45.0)
pCO2 arterial: 75.8 mmHg (ref 35.0–45.0)
pH, Arterial: 7.315 — ABNORMAL LOW (ref 7.350–7.450)
pH, Arterial: 7.383 (ref 7.350–7.450)
pH, Arterial: 6.935 — CL (ref 7.350–7.450)
pH, Arterial: 7.315 — ABNORMAL LOW (ref 7.350–7.450)
pH, Arterial: 7.361 (ref 7.350–7.450)
pO2, Arterial: 96.9 mmHg (ref 80.0–100.0)
pO2, Arterial: 114 mmHg — ABNORMAL HIGH (ref 80.0–100.0)
pO2, Arterial: 217 mmHg — ABNORMAL HIGH (ref 80.0–100.0)
pO2, Arterial: 54.5 mmHg — ABNORMAL LOW (ref 80.0–100.0)
pO2, Arterial: 87.3 mmHg (ref 80.0–100.0)

## 2010-10-18 LAB — COMPREHENSIVE METABOLIC PANEL
ALT: 70 U/L — ABNORMAL HIGH (ref 0–53)
ALT: 17 U/L (ref 0–53)
AST: 50 U/L — ABNORMAL HIGH (ref 0–37)
Albumin: 2.1 g/dL — ABNORMAL LOW (ref 3.5–5.2)
Albumin: 2.2 g/dL — ABNORMAL LOW (ref 3.5–5.2)
Alkaline Phosphatase: 108 U/L (ref 39–117)
Alkaline Phosphatase: 85 U/L (ref 39–117)
BUN: 35 mg/dL — ABNORMAL HIGH (ref 6–23)
BUN: 20 mg/dL (ref 6–23)
BUN: 9 mg/dL (ref 6–23)
CO2: 42 mEq/L — ABNORMAL HIGH (ref 19–32)
CO2: 36 mEq/L — ABNORMAL HIGH (ref 19–32)
CO2: 25 mEq/L (ref 19–32)
Calcium: 8.9 mg/dL (ref 8.4–10.5)
Chloride: 104 mEq/L (ref 96–112)
Chloride: 105 mEq/L (ref 96–112)
Creatinine, Ser: 0.87 mg/dL (ref 0.4–1.5)
Creatinine, Ser: 0.85 mg/dL (ref 0.4–1.5)
Creatinine, Ser: 0.86 mg/dL (ref 0.4–1.5)
GFR calc Af Amer: >60 mL/min (ref >60)
GFR calc non Af Amer: >60 mL/min (ref >60)
GFR calc non Af Amer: >60 mL/min (ref >60)
Glucose, Bld: 200 mg/dL — ABNORMAL HIGH (ref 70–99)
Glucose, Bld: 136 mg/dL — ABNORMAL HIGH (ref 70–99)
Potassium: 4.6 mEq/L (ref 3.5–5.1)
Potassium: 3.3 mEq/L — ABNORMAL LOW (ref 3.5–5.1)
Sodium: 151 mEq/L — ABNORMAL HIGH (ref 135–145)
Sodium: 137 mEq/L (ref 135–145)
Total Bilirubin: 0.2 mg/dL — ABNORMAL LOW (ref 0.3–1.2)
Total Bilirubin: 0.2 mg/dL — ABNORMAL LOW (ref 0.3–1.2)
Total Bilirubin: 0.4 mg/dL (ref 0.3–1.2)
Total Protein: 7.8 g/dL (ref 6.0–8.3)
Total Protein: 7.3 g/dL (ref 6.0–8.3)

## 2010-10-18 LAB — CBC
HCT: 35.7 % — ABNORMAL LOW (ref 39.0–52.0)
HCT: 38.2 % — ABNORMAL LOW (ref 39.0–52.0)
HCT: 39.4 % (ref 39.0–52.0)
HCT: 36.3 % — ABNORMAL LOW (ref 39.0–52.0)
HCT: 40 % (ref 39.0–52.0)
HCT: 40.9 % (ref 39.0–52.0)
HCT: 40.9 % (ref 39.0–52.0)
HCT: 47.4 % (ref 39.0–52.0)
HCT: 47.5 % (ref 39.0–52.0)
Hemoglobin: 12.4 g/dL — ABNORMAL LOW (ref 13.0–17.0)
Hemoglobin: 12.6 g/dL — ABNORMAL LOW (ref 13.0–17.0)
Hemoglobin: 12.6 g/dL — ABNORMAL LOW (ref 13.0–17.0)
Hemoglobin: 13.3 g/dL (ref 13.0–17.0)
Hemoglobin: 13.5 g/dL (ref 13.0–17.0)
Hemoglobin: 13.7 g/dL (ref 13.0–17.0)
Hemoglobin: 16.3 g/dL (ref 13.0–17.0)
Hemoglobin: 16.8 g/dL (ref 13.0–17.0)
MCHC: 33.2 g/dL (ref 30.0–36.0)
MCHC: 34.6 g/dL (ref 30.0–36.0)
MCHC: 34.7 g/dL (ref 30.0–36.0)
MCHC: 35.3 g/dL (ref 30.0–36.0)
MCHC: 34.9 g/dL (ref 30.0–36.0)
MCHC: 35.1 g/dL (ref 30.0–36.0)
MCV: 86.6 fL (ref 78.0–100.0)
MCV: 87.5 fL (ref 78.0–100.0)
MCV: 89.1 fL (ref 78.0–100.0)
MCV: 87.4 fL (ref 78.0–100.0)
MCV: 87.5 fL (ref 78.0–100.0)
MCV: 88.4 fL (ref 78.0–100.0)
MCV: 88.6 fL (ref 78.0–100.0)
Platelets: 170 10*3/uL (ref 150–400)
Platelets: 192 10*3/uL (ref 150–400)
Platelets: 206 10*3/uL (ref 150–400)
Platelets: 166 10*3/uL (ref 150–400)
Platelets: 185 10*3/uL (ref 150–400)
Platelets: 187 10*3/uL (ref 150–400)
Platelets: 193 10*3/uL (ref 150–400)
Platelets: 206 10*3/uL (ref 150–400)
RBC: 4.11 MIL/uL — ABNORMAL LOW (ref 4.22–5.81)
RBC: 4.23 MIL/uL (ref 4.22–5.81)
RBC: 4.26 MIL/uL (ref 4.22–5.81)
RBC: 4.32 MIL/uL (ref 4.22–5.81)
RBC: 4.38 MIL/uL (ref 4.22–5.81)
RBC: 4.62 MIL/uL (ref 4.22–5.81)
RBC: 5.44 MIL/uL (ref 4.22–5.81)
RDW: 13.4 % (ref 11.5–15.5)
RDW: 13.5 % (ref 11.5–15.5)
RDW: 13.7 % (ref 11.5–15.5)
RDW: 13.9 % (ref 11.5–15.5)
RDW: 14.6 % (ref 11.5–15.5)
RDW: 13.6 % (ref 11.5–15.5)
RDW: 13.7 % (ref 11.5–15.5)
RDW: 14.2 % (ref 11.5–15.5)
RDW: 14.4 % (ref 11.5–15.5)
WBC: 10 10*3/uL (ref 4.0–10.5)
WBC: 10.2 10*3/uL (ref 4.0–10.5)
WBC: 12.4 10*3/uL — ABNORMAL HIGH (ref 4.0–10.5)
WBC: 14.1 10*3/uL — ABNORMAL HIGH (ref 4.0–10.5)
WBC: 14.1 10*3/uL — ABNORMAL HIGH (ref 4.0–10.5)
WBC: 13.6 10*3/uL — ABNORMAL HIGH (ref 4.0–10.5)
WBC: 11.6 10*3/uL — ABNORMAL HIGH (ref 4.0–10.5)
WBC: 13.8 10*3/uL — ABNORMAL HIGH (ref 4.0–10.5)

## 2010-10-18 LAB — BASIC METABOLIC PANEL
BUN: 18 mg/dL (ref 6–23)
BUN: 18 mg/dL (ref 6–23)
BUN: 20 mg/dL (ref 6–23)
BUN: 21 mg/dL (ref 6–23)
BUN: 22 mg/dL (ref 6–23)
BUN: 32 mg/dL — ABNORMAL HIGH (ref 6–23)
BUN: 32 mg/dL — ABNORMAL HIGH (ref 6–23)
BUN: 11 mg/dL (ref 6–23)
BUN: 20 mg/dL (ref 6–23)
BUN: 21 mg/dL (ref 6–23)
CO2: 28 mEq/L (ref 19–32)
CO2: 29 mEq/L (ref 19–32)
CO2: 35 mEq/L — ABNORMAL HIGH (ref 19–32)
CO2: 40 mEq/L — ABNORMAL HIGH (ref 19–32)
CO2: 43 mEq/L — ABNORMAL HIGH (ref 19–32)
CO2: 22 mEq/L (ref 19–32)
CO2: 23 mEq/L (ref 19–32)
CO2: 35 mEq/L — ABNORMAL HIGH (ref 19–32)
Calcium: 8.6 mg/dL (ref 8.4–10.5)
Calcium: 8.9 mg/dL (ref 8.4–10.5)
Calcium: 8.9 mg/dL (ref 8.4–10.5)
Calcium: 8.9 mg/dL (ref 8.4–10.5)
Calcium: 9.1 mg/dL (ref 8.4–10.5)
Calcium: 8.3 mg/dL — ABNORMAL LOW (ref 8.4–10.5)
Chloride: 100 mEq/L (ref 96–112)
Chloride: 103 mEq/L (ref 96–112)
Chloride: 104 mEq/L (ref 96–112)
Chloride: 93 mEq/L — ABNORMAL LOW (ref 96–112)
Chloride: 95 mEq/L — ABNORMAL LOW (ref 96–112)
Chloride: 102 mEq/L (ref 96–112)
Chloride: 103 mEq/L (ref 96–112)
Chloride: 108 mEq/L (ref 96–112)
Creatinine, Ser: 0.5 mg/dL (ref 0.4–1.5)
Creatinine, Ser: 0.51 mg/dL (ref 0.4–1.5)
Creatinine, Ser: 0.58 mg/dL (ref 0.4–1.5)
Creatinine, Ser: 0.6 mg/dL (ref 0.4–1.5)
Creatinine, Ser: 0.64 mg/dL (ref 0.4–1.5)
Creatinine, Ser: 0.71 mg/dL (ref 0.4–1.5)
Creatinine, Ser: 0.75 mg/dL (ref 0.4–1.5)
Creatinine, Ser: 0.96 mg/dL (ref 0.4–1.5)
Creatinine, Ser: 0.99 mg/dL (ref 0.4–1.5)
GFR calc Af Amer: >60 mL/min (ref >60)
GFR calc Af Amer: >60 mL/min (ref >60)
GFR calc Af Amer: >60 mL/min (ref >60)
GFR calc Af Amer: >60 mL/min (ref >60)
GFR calc Af Amer: >60 mL/min (ref >60)
GFR calc non Af Amer: >60 mL/min (ref >60)
GFR calc non Af Amer: >60 mL/min (ref >60)
GFR calc non Af Amer: >60 mL/min (ref >60)
GFR calc non Af Amer: >60 mL/min (ref >60)
GFR calc non Af Amer: >60 mL/min (ref >60)
GFR calc non Af Amer: >60 mL/min (ref >60)
GFR calc non Af Amer: >60 mL/min (ref >60)
GFR calc non Af Amer: >60 mL/min (ref >60)
Glucose, Bld: 143 mg/dL — ABNORMAL HIGH (ref 70–99)
Glucose, Bld: 150 mg/dL — ABNORMAL HIGH (ref 70–99)
Glucose, Bld: 151 mg/dL — ABNORMAL HIGH (ref 70–99)
Glucose, Bld: 154 mg/dL — ABNORMAL HIGH (ref 70–99)
Glucose, Bld: 160 mg/dL — ABNORMAL HIGH (ref 70–99)
Glucose, Bld: 98 mg/dL (ref 70–99)
Glucose, Bld: 126 mg/dL — ABNORMAL HIGH (ref 70–99)
Glucose, Bld: 142 mg/dL — ABNORMAL HIGH (ref 70–99)
Glucose, Bld: 151 mg/dL — ABNORMAL HIGH (ref 70–99)
Glucose, Bld: 170 mg/dL — ABNORMAL HIGH (ref 70–99)
Potassium: 3.7 mEq/L (ref 3.5–5.1)
Potassium: 3.7 mEq/L (ref 3.5–5.1)
Potassium: 3.9 mEq/L (ref 3.5–5.1)
Potassium: 4.2 mEq/L (ref 3.5–5.1)
Potassium: 4.4 mEq/L (ref 3.5–5.1)
Potassium: 4.6 mEq/L (ref 3.5–5.1)
Potassium: 3.9 mEq/L (ref 3.5–5.1)
Potassium: 4.1 mEq/L (ref 3.5–5.1)
Potassium: 4.2 mEq/L (ref 3.5–5.1)
Potassium: 4.2 mEq/L (ref 3.5–5.1)
Sodium: 137 mEq/L (ref 135–145)
Sodium: 139 mEq/L (ref 135–145)
Sodium: 141 mEq/L (ref 135–145)
Sodium: 144 mEq/L (ref 135–145)
Sodium: 146 mEq/L — ABNORMAL HIGH (ref 135–145)
Sodium: 141 mEq/L (ref 135–145)
Sodium: 148 mEq/L — ABNORMAL HIGH (ref 135–145)

## 2010-10-18 LAB — URINALYSIS, ROUTINE W REFLEX MICROSCOPIC
Glucose, UA: NEGATIVE mg/dL
Glucose, UA: NEGATIVE mg/dL
Hgb urine dipstick: NEGATIVE
Ketones, ur: NEGATIVE mg/dL
Specific Gravity, Urine: 1.02 (ref 1.005–1.030)
Specific Gravity, Urine: 1.024 (ref 1.005–1.030)
Urobilinogen, UA: 0.2 mg/dL (ref 0.0–1.0)
pH: 6.5 (ref 5.0–8.0)

## 2010-10-18 LAB — CULTURE, BAL-QUANTITATIVE W GRAM STAIN: Colony Count: >=100000

## 2010-10-18 LAB — MAGNESIUM
Magnesium: 2.6 mg/dL — ABNORMAL HIGH (ref 1.5–2.5)
Magnesium: 2.6 mg/dL — ABNORMAL HIGH (ref 1.5–2.5)
Magnesium: 2.9 mg/dL — ABNORMAL HIGH (ref 1.5–2.5)

## 2010-10-18 LAB — PROTEIN ELECTROPHORESIS, SERUM
Alpha-2-Globulin: 11.5 % (ref 7.1–11.8)
Gamma Globulin: 13.1 % (ref 11.1–18.8)
M-Spike, %: NOT DETECTED g/dL
Total Protein ELP: 6.7 g/dL (ref 6.0–8.3)

## 2010-10-18 LAB — DIFFERENTIAL
Basophils Absolute: 0 10*3/uL (ref 0.0–0.1)
Basophils Absolute: 0 10*3/uL (ref 0.0–0.1)
Basophils Absolute: 0.1 10*3/uL (ref 0.0–0.1)
Basophils Relative: 0 % (ref 0–1)
Eosinophils Absolute: 0.3 10*3/uL (ref 0.0–0.7)
Eosinophils Absolute: 0.1 10*3/uL (ref 0.0–0.7)
Eosinophils Relative: 3 % (ref 0–5)
Eosinophils Relative: 2 % (ref 0–5)
Lymphocytes Relative: 13 % (ref 12–46)
Lymphocytes Relative: 4 % — ABNORMAL LOW (ref 12–46)
Lymphocytes Relative: 22 % (ref 12–46)
Lymphs Abs: 2.6 10*3/uL (ref 0.7–4.0)
Monocytes Absolute: 0.8 10*3/uL (ref 0.1–1.0)
Monocytes Absolute: 1.2 10*3/uL — ABNORMAL HIGH (ref 0.1–1.0)
Neutro Abs: 11.5 10*3/uL — ABNORMAL HIGH (ref 1.7–7.7)
Neutro Abs: 7.9 10*3/uL — ABNORMAL HIGH (ref 1.7–7.7)
Neutro Abs: 9.6 10*3/uL — ABNORMAL HIGH (ref 1.7–7.7)
Neutrophils Relative %: 70 % (ref 43–77)

## 2010-10-18 LAB — CEA (CARCINOEMBRYONIC ANTIGEN), FLUID: CEA Fluid: <0.2 not reported

## 2010-10-18 LAB — URINALYSIS, MICROSCOPIC ONLY
Glucose, UA: 250 mg/dL — AB
Protein, ur: 30 mg/dL — AB
Specific Gravity, Urine: 1.026 (ref 1.005–1.030)
pH: 6 (ref 5.0–8.0)

## 2010-10-18 LAB — TSH: TSH: 0.903 u[IU]/mL (ref 0.350–4.500)

## 2010-10-18 LAB — URINE CULTURE
Culture: NO GROWTH
Special Requests: NEGATIVE

## 2010-10-18 LAB — CRYPTOCOCCAL ANTIGEN, CSF: Crypto Ag: NEGATIVE

## 2010-10-18 LAB — VDRL, CSF: VDRL Quant, CSF: NONREACTIVE

## 2010-10-18 LAB — APTT: aPTT: 29 seconds (ref 24–37)

## 2010-10-18 LAB — FUNGUS CULTURE W SMEAR

## 2010-10-18 LAB — AFB CULTURE WITH SMEAR (NOT AT ARMC)

## 2010-10-18 LAB — B. BURGDORFI ANTIBODIES BY WB: B burgdorferi IgG Abs (IB): NEGATIVE

## 2010-10-18 LAB — B. BURGDORFI ANTIBODIES, CSF: Lyme Ab: 1.72 LIV

## 2010-10-18 LAB — PHOSPHORUS
Phosphorus: 2.5 mg/dL (ref 2.3–4.6)
Phosphorus: 4.9 mg/dL — ABNORMAL HIGH (ref 2.3–4.6)

## 2010-10-18 LAB — BRAIN NATRIURETIC PEPTIDE: Pro B Natriuretic peptide (BNP): 173 pg/mL — ABNORMAL HIGH (ref 0.0–100.0)

## 2010-10-18 LAB — CSF CELL COUNT WITH DIFFERENTIAL
Lymphs, CSF: 97 % — ABNORMAL HIGH (ref 40–80)
Monocyte-Macrophage-Spinal Fluid: 3 % — ABNORMAL LOW (ref 15–45)
WBC, CSF: 70 /mm3 — ABNORMAL HIGH (ref 0–5)

## 2010-10-18 LAB — CULTURE, RESPIRATORY W GRAM STAIN

## 2010-10-18 LAB — PROTIME-INR
INR: 1 (ref 0.00–1.49)
Prothrombin Time: 13.1 seconds (ref 11.6–15.2)

## 2010-10-18 LAB — HEPATITIS PANEL, ACUTE
Hep A IgM: NEGATIVE
Hep B C IgM: NEGATIVE
Hepatitis B Surface Ag: NEGATIVE

## 2010-10-18 LAB — LACTIC ACID, PLASMA: Lactic Acid, Venous: 1.2 mmol/L (ref 0.5–2.2)

## 2010-10-18 LAB — FUNGUS CULTURE, BLOOD: Culture: NO GROWTH

## 2010-10-18 LAB — CREATININE, URINE, RANDOM: Creatinine, Urine: 86.7 mg/dL

## 2010-10-18 LAB — PROTEIN AND GLUCOSE, CSF: Glucose, CSF: 100 mg/dL — ABNORMAL HIGH (ref 43–76)

## 2010-10-18 LAB — HIV ANTIBODY (ROUTINE TESTING W REFLEX): HIV: NONREACTIVE

## 2010-10-18 LAB — PREALBUMIN: Prealbumin: 22.5 mg/dL (ref 18.0–45.0)

## 2010-10-18 LAB — HEAVY METALS SCREEN, URINE

## 2010-11-15 ENCOUNTER — Encounter: Payer: Self-pay | Attending: Physical Medicine & Rehabilitation | Admitting: Neurosurgery

## 2010-11-15 DIAGNOSIS — G894 Chronic pain syndrome: Secondary | ICD-10-CM

## 2010-11-15 DIAGNOSIS — G610 Guillain-Barre syndrome: Secondary | ICD-10-CM | POA: Insufficient documentation

## 2010-11-15 NOTE — Assessment & Plan Note (Signed)
Account [id number].  Mr. Worton is a patient of Dr. Riley Kill with a history of Guillain-Barre syndrome.  He also has chronic pain related to that.  He has been having difficulty paying for some of his medications.  He has applied for Medicaid and waiting for that to come through.  The patient rates his average pain right now about 5.  He has some tingling and aching.  He does rate his activity level is anywhere from 0 to 8.  The pain is worse with sitting and inactivity and then some other activities do aggravate his pain.  He gets on average of 6, relief with rest and medications.  He walks without assistance.  He does have AFOs on both feet and ankles.  He can walk about 20-30 minutes.  He does drive, he can climb steps.  He has again applied for disability.  REVIEW OF SYSTEMS:  Notable for those difficulties described in the history of present illness, past medical history.  His review of systems sheet is otherwise unremarkable except for some nausea.  He does have some mild anxiety and dizziness from time to time.  PAST MEDICAL HISTORY:  Unchanged.  SOCIAL HISTORY:  He is married, lives with wife.  FAMILY HISTORY:  Significant for heart disease, hypertension, and diabetes.  PHYSICAL EXAMINATION:  VITAL SIGNS:  His blood pressure is 144/78, his pulse is 87, respirations are 18, O2 sats 98% on room air. MUSCULOSKELETAL:  He has gotten about 4/5 in his motor strength in his grip and intrinsics bilaterally.  He had surgery on the left-hand and states that he had some improvement with that with Dr. Mina Marble.  Again, he has AFOs on.  In the lower extremities, he cannot dorsiflex, he has 5/5 with plantar flexion, and he has gotten good strength in his iliopsoas. NEUROLOGICAL:  Sensation is somewhat diminished bilaterally in the lower extremities, otherwise he is alert and oriented.  His affect is bright. Gait is fairly normal with his braces on.  Constitutionally, he is within normal  limits.  ASSESSMENT: 1. History of Guillain-Barre syndrome. 2. Chronic pain.  PLAN: 1. He will continue on his current medication regimen. 2. We went ahead and refilled his baclofen 10 mg one t.i.d., 90, with     3 refills.  Refilled his fentanyl 75 mcg patches 10 of them one     every 72 hours. 3. Norco 10/325 one every 6 hours #90 with no refill. 4. He will follow up here in the clinic in 2 months with nurse to help     with his financial situation.  He and his wife's questions were     encouraged and answered.    Jamesmichael Shadd L. Blima Dessert   RLW/MedQ D:  11/15/2010 10:39:30  T:  11/15/2010 23:41:33  Job #:  [id number]

## 2010-11-17 ENCOUNTER — Emergency Department (HOSPITAL_COMMUNITY)
Admission: EM | Admit: 2010-11-17 | Discharge: 2010-11-17 | Disposition: A | Discharge status: Home / Self Care | Payer: Self-pay | Attending: Emergency Medicine | Admitting: Emergency Medicine

## 2010-11-17 DIAGNOSIS — M795 Residual foreign body in soft tissue: Secondary | ICD-10-CM | POA: Insufficient documentation

## 2010-11-17 DIAGNOSIS — Z79899 Other long term (current) drug therapy: Secondary | ICD-10-CM | POA: Insufficient documentation

## 2010-11-17 DIAGNOSIS — Z1839 Other retained organic fragments: Secondary | ICD-10-CM | POA: Insufficient documentation

## 2010-11-17 DIAGNOSIS — G610 Guillain-Barre syndrome: Secondary | ICD-10-CM | POA: Insufficient documentation

## 2010-11-22 NOTE — H&P (Signed)
NAME:  DAK, SZUMSKI NO.:  [id number]   MEDICAL RECORD NO.:  [id number]          PATIENT TYPE:  INP   LOCATION:  3022                         FACILITY:  [facility]   PHYSICIAN:  Marlan Palau, M.D.  DATE OF BIRTH:  1965/12/11   DATE OF ADMISSION:  11/12/2008  DATE OF DISCHARGE:                              HISTORY & PHYSICAL   HISTORY OF PRESENT ILLNESS:  Zachary Mcbride is a 45 year old right-handed  white male born December 14, 1965, with a history of low back pain that  began 4 days prior to this evaluation.  The patient has gone to the  emergency room for an evaluation and underwent an MRI of the low back  that showed evidence of small central disks at the L4-5 and L5-S1  levels.  The patient was felt to have low back pain related to this.  The patient is placed on prednisone 40 mg daily.  Two days ago, the  patient began having some numbness in the hands and feet and was  generally weak.  Today, the numbness has worsened and has included the  buttocks area, and the patient is not able to ambulate.  The patient has  had relative constipation and has had some hesitancy of his bladder, but  no true incontinence or problems fully voiding the bladder.  The patient  denies numbness on the body with the exception of the buttocks area.  MRI scan of the cervical spine has been done and is unremarkable.  The  patient is being admitted at this time for what appears to be Guillain-  Barre syndrome.   Past medical history is relatively unremarkable with the exception of  the current medical illness, likely related to Guillain-Barre syndrome.  No surgical history noted.   The only medication is prednisone 20 mg 2 daily.   The patient has no known allergies.   SOCIAL HISTORY:  Smokes a pack of cigarettes daily.  Does not drink  alcohol.  The patient is married, lives in the [place], [place] area.  The patient works making edge banding.  The patient has  2 children,  who are alive and well.   FAMILY MEDICAL HISTORY:  Notable that father has heart disease,  diabetes, cholesterol issues, and has had a recent coronary artery  stent.  Mother has diabetes, cancer, cholesterol issues.  The patient  has 1 brother, who is alive and well.  One of the patient's daughters  has asthma.   REVIEW OF SYSTEMS:  Notable for some slight fever.  No viral illness  noted recently.  The patient has no neck pain.  Denies shortness of  breath, denies headache, denies double vision.  He has had no dizziness  or loss of consciousness.   PHYSICAL EXAMINATION:  VITAL SIGNS:  Blood pressure 151/90, heart rate  83, respiratory rate 20, temperature afebrile.  GENERAL:  This patient is a fair, well-developed white male who is alert  and cooperative at the time of examination.  HEENT:  Head is atraumatic.  Eyes, pupils were equal, round, and  reactive to light.  Disks are flat bilaterally.  NECK:  Supple.  No  carotid bruits noted.  RESPIRATORY:  Clear.  CARDIOVASCULAR:  Regular rate and rhythm.  No obvious murmurs or rubs  noted.  EXTREMITIES:  Without significant edema.  ABDOMEN:  Positive bowel sounds.  No organomegaly or tenderness noted.  NEUROLOGIC:  Cranial nerves as above.  Facial symmetry is present.  The  patient has good strength of the facial muscles and the muscle of the  head turn and shoulder shrug bilaterally.  Speech is well enunciated,  not aphasic.  Extraocular movements are full.  Visual fields are full.  Motor testing reveals weakness in the intrinsic muscles of the hands  bilaterally in a symmetric fashion.  Bilateral foot drops are noted,  otherwise fairly good strength is noted throughout.  The patient is  noted to have ataxia with finger-nose-finger and heel-to-shin.  The  patient is not able to ambulate on his own.  He cannot stand without  assistance.  The patient has a stocking-glove pinprick sensory deficit  to the knees bilaterally and distal  two-thirds of the forearms  bilaterally.  The patient has impairment of vibratory sensation of the  feet, not in the arms.  The patient has areflexia.  Toes are downgoing.   Laboratory values are pending at this time.   IMPRESSION:  Probable Guillain-Barre syndrome.   This patient otherwise has no medical issues.  We will admit this  patient for evaluation and management of Guillain-Barre syndrome.  The  patient's symptoms began 4 days ago and already is non-ambulatory.   PLAN:  1. Transfer to [facility].  2. Admission laboratory values to include immunoelectrophoresis, sed      rate, HIV titer, and hepatitis B antibody panel,  3. Initiate IV IgG.  4. Bedrest.  5. We will order physical and occupational therapy when appropriate.  6. We will need to follow closely and transfer to intensive care unit      if and when needed.  7. We will consider a lumbar puncture sometime in the next 5-6 days.      Marlan Palau, M.D.  Electronically Signed     CKW/MEDQ  D:  11/12/2008  T:  11/13/2008  Job:  [id number]   cc:   [facility]

## 2010-11-22 NOTE — Discharge Summary (Signed)
NAMERANSOME, HELWIG                 ACCOUNT NO.:  [id number]   MEDICAL RECORD NO.:  [id number]          PATIENT TYPE:  INP   LOCATION:  3103                         FACILITY:  [facility]   PHYSICIAN:  Marlan Palau, M.D.  DATE OF BIRTH:  19-May-1966   DATE OF ADMISSION:  11/12/2008  DATE OF DISCHARGE:  11/30/2008                               DISCHARGE SUMMARY   ADMISSION DIAGNOSIS:  Guillain-Barre.   DISCHARGE DIAGNOSIS:  Guillain-Barre.   PROCEDURES:  The patient underwent a lumbar puncture on Nov 20, 2008.  Underwent a percutaneous tracheostomy on Nov 23, 2008, and also  underwent a bronchoscopy on Nov 23, 2008.   MEDICATIONS:  1. Rocephin 2 g q.24 h.  2. Ventolin 6 puffs inhaled q.6 h.  3. Klonopin 1 mg p.o. b.i.d.  4. Duragesic 100 mcg patch q.72 h.  5. Seroquel 25 mg tube q.h.s.  6. Protonix 40 mg tube q.12 h.  7. Diamox 250 mg IV q.8 h.  8. Osmolite 1000 cc tube q.15 h.  9. Lovenox 40 mg injections subcutaneous daily.  10.NovoLog 2-6 units subcutaneous q.4 h.  11.__________ 10 mg IV q.6 h. p.r.n.  12.Morphine 2-4 mg IV q.1 h. p.r.n.  13.Lortab 7.5/500 per peg tube q.3 h. p.r.n.  14.Tylenol 650 mg q.4 h. p.r.n.  15.Phenergan 6.25 mg IV q.3 h. p.r.n.   HISTORY:  Gregary Blackard is a 45 year old right-handed male born September 28, 1955, with a history of low back pain that began 4 days prior to  evaluation.  The patient had gone to the emergency room.  For evaluation  underwent an MRI of his low back, which showed evidence of small central  disk at L4-5 and L5-S1 levels.  The patient was felt to have low back  pain related to this.  The patient was placed on prednisone 40 mg a day.  Two days after, the patient began having some numbness in hands and feet  and was generally weak.  On the date of evaluation, Nov 12, 2008, the  patient had relative constipation, had some hesitance in his bladder, no  true incontinence or problems fully voiding his bladder.  The patient at  that time  denied numbness on the body with the exception of his buttock  region.  The patient was being admitted for Guillain-Barre syndrome.   PAST MEDICAL HISTORY:  Relatively unremarkable with the exception of the  current medical illness.   The only medication the patient was on at that time was prednisone 20 mg  2 times daily.   PHYSICAL EXAMINATION:  The patient at time of interview has a  tracheostomy.  He has failed trial of spontaneous breathing and is on a  ventilator.  The patient's eyes are taped shut.  The patient does follow  simple midline commands and is able to protrude his tongue.  He is  flaccidly quadriplegic and has no deep tendon reflexes.  While the  patient was in the hospital, the patient was empirically placed on  Rocephin 2 g IV q.24 h.   LABORATORY DATA:  The patient's Chad Nile  virus came back negative.  Lyme disease titer ELISA came back positive, however Western blot is  still pending.  HIV came back negative.  CEA negative.  VDRL from CSF  negative.  Cryptococcal antigen negative.  Heavy metal profile was  negative.  BMP shows sodium of 138, potassium 3.8, chloride 96, CO2 38,  glucose 151, BUN 30, creatinine of 0.75.  CBC shows white blood cell  count of 10.0, hemoglobin is 12.8, hematocrit is 37.8, platelet count is  220.  The patient's magnesium was 2.6.  Phosphorus 3.1.  Respiratory  cultures on the 15th showed Candida albicans.  LP results showed a  protein of 157 and glucose of 100, a white blood cell count of 70, red  blood cell count of 1, lymphocyte count of 97, a monocyte, macrophage  spinal fluid of 3.  Urine culture on May 12 showed no growth.   THE PATIENT'S COURSE OF TREATMENT:  While the patient has been  hospitalized, he has had complications of Haemophilus influenzae and  pneumococcal pneumonia, which he has been placed on ceftriaxone.  He has  been also on ceftriaxone to cover possible lymes disease.  He has also  been placed on a 5-day course  of IVIG for Guillain-Barre with no  complications, where it was objectively noted that the patient had  increase in sensation.   Due to patient's long-term need for ventilatory assistance and  capability of recovery, the patient at this point in time is in need of  an LTAC facility.  At this time Lyme Western blot is pending and we will  continue Rocephin for now.   DISPOSITION AT THIS TIME:  As stated, we will discharge the patient to  [facility] facility for continuation of ventilatory assistance.  We  will continue Rocephin, consider plasmapheresis and await the pending  labs of the Western blot.  We will continue to follow this patient while  he is at [facility].     ______________________________  Felicie Morn, PA-C      C. Lesia Sago, M.D.  Electronically Signed    DS/MEDQ  D:  11/30/2008  T:  11/30/2008  Job:  [id number]

## 2010-11-22 NOTE — Op Note (Signed)
NAMEEUSTACE, HUR                 ACCOUNT NO.:  [id number]   MEDICAL RECORD NO.:  [id number]          PATIENT TYPE:  [facility]   LOCATION:  5704                         FACILITY:  [facility]   PHYSICIAN:  Felipa Evener, MD  DATE OF BIRTH:  08-May-1966   DATE OF PROCEDURE:  12/01/2008  DATE OF DISCHARGE:                               OPERATIVE REPORT   PROCEDURE PERFORMED:  Fiberoptic bronchoscopy with bronchoalveolar  lavage.   INDICATION FOR PROCEDURE:  Left lower lobe collapse.   Consent was required from the patient's wife over the phone with two RNs  witnesses filed in the chart.   After giving the patient 1 mg IV Versed and 50 mcg IV fentanyl,  bronchoscope was passed through the patient's trach to the carina.  A 10  mL of 1% lidocaine were instilled to the carina.  Bronchoscope was then  passed through the right mainstem bronchus.  Right upper, middle, and  lower lobe all appeared within normal limits except for excessive amount  of __________ suction.  Bronchoscope was passed __________ bronchus.  The left upper lobe and lingula appeared to be within normal limits at  the lateral segment of the left lower lobe.  There was increased amount  of secretion, appeared most like tube feeds.  The old secretion was  suctioned and then bronchoscope was withdrawn to carina __________.  The  patient tolerated the procedure well.  No complication.  Samples to be  sent for bacterial, fungal, AFB staining, and culture as well as cell  count with differential.  The patient is already on antibiotics.      Felipa Evener, MD  Electronically Signed     WJY/MEDQ  D:  12/03/2008  T:  12/04/2008  Job:  [id number]

## 2010-11-22 NOTE — Op Note (Signed)
NAME:  Zachary Mcbride, Zachary Mcbride                 ACCOUNT NO.:  [id number]   MEDICAL RECORD NO.:  [id number]           PATIENT TYPE:   LOCATION:                                 FACILITY:   PHYSICIAN:  Coralyn Helling, MD        DATE OF BIRTH:  10-08-1965   DATE OF PROCEDURE:  11/23/2008  DATE OF DISCHARGE:                               OPERATIVE REPORT   PREOPERATIVE DIAGNOSIS:  Acute-on-chronic respiratory failure in the  setting of Guillain-Barre syndrome.   POSTOPERATIVE DIAGNOSIS:  Acute-on-chronic respiratory failure in the  setting of Guillain-Barre syndrome.   PROCEDURE:  Bronchoscopic assistance for percutaneous tracheostomy.   INDICATIONS:  Zachary Mcbride is a 45 year old male who is admitted with  progressive weakness and found have Guillain-Barre syndrome.  He has  acute-on-chronic respiratory failure requiring mechanical ventilatory  support.  As a result of this, he is scheduled to undergo tracheostomy  placement for continued ventilatory support.  Please see the  accompanying note from Dr. Rory Percy and Dr. Kalman Shan for  details about the percutaneous tracheostomy insertion.  The procedure  was done in the intensive care unit.  The patient was on full  ventilatory support and preoxygenated to 1% FiO2.  The bronchoscope was  entered into the endotracheal tube.  There was good visualization of the  carina.  The endotracheal tube was retracted until there was  visualization of the finder needle entering the trachea.  The guidewire  was then entered.  There was no evidence for posterior wall trauma.  The  dilator and then tracheostomy tubes were visualized entering the trachea  without difficulty and no significant amount of bleeding.  The  bronchoscope was then withdrawn from the endotracheal tube and entered  through the tracheostomy tube and there was good visualization of the  airway and it appeared to the tracheostomy tube was in good position.  Then, airway inspection was  done first looking at the left upper  lingular and lower lobe orifices.  There was no obvious endobronchial  lesions here, although there was thick secretions, which were easily  suctioned.  The bronchoscope was then entered into the right main  bronchus.  The right upper, middle, and lower lobe orifices were all  visualized and there is no obvious endobronchial lesions here.  However,  at the tracheal rings just distal to the endotracheal tube, there did  appear to be superficial erosion with a clean white base on the anterior  tracheal wall.  The bronchoscope was then withdrawn.  A postprocedure  chest x-ray is pending at this time.  The patient had a transient  hypotension related to sedation infusion, otherwise tolerated the  procedure well and there is no immediate complications.      Coralyn Helling, MD  Electronically Signed     VS/MEDQ  D:  11/23/2008  T:  11/24/2008  Job:  [id number]

## 2010-11-22 NOTE — Consult Note (Signed)
NAMEBRANON, SABINE                 ACCOUNT NO.:  [id number]   MEDICAL RECORD NO.:  [id number]          PATIENT TYPE:  INP   LOCATION:  3103                         FACILITY:  [facility]   PHYSICIAN:  Hedwig Morton. Juanda Chance, MD     DATE OF BIRTH:  07/22/1965   DATE OF CONSULTATION:  DATE OF DISCHARGE:                                 CONSULTATION   PROCEDURE:  Percutaneous endoscopic gastrostomy.   INDICATIONS:  This 45 year old white male was admitted on Nov 12, 2008,  with what was diagnosed as Guillain-Barre syndrome, which started with a  low back pain and progressive weakness.  He has developed severe  neurological deficits, respiratory compromise, and has been ventilator  dependent.  He has tolerated a Panda tube feedings.  The patient  underwent tracheostomy yesterday and is scheduled for percutaneous  gastrostomy to date for long-term nutritional support.   Permission was obtained from the patient, as well as from his wife.   ENDOSCOPE:  Fujinon single-channel videoscope.   SEDATION:  Fentanyl 50 mcg IV, Versed 5 mg IV.   FINDINGS:  Fujinon single-channel videoendoscope passed still under  direct vision through the posterior pharynx by the endotracheal tube  into the stomach.  The esophagus, gastric mucosa, and duodenum were  normal.  The stomach was transilluminated, and the gastrostomy site was  located in the epigastrium to the left of the midline.  Skin was  infiltrated with 1% Xylocaine with epinephrine, and a small incision was  made through which a Seldinger needle passed into the gastric lumen.  Through the Seldinger needle, guidewire passed and was grabbed with a  snare and advanced through the esophagus and outside of the mouth.  The  gastrostomy tube was then placed over the guidewire with the tapered end  first and advanced through the esophagus through the stomach and to the  gastrostomy opening.  The T bumper of the gastrostomy tube was snugged  against the abdominal wall.   The retention disks and adaptors were  placed.  Video photographs were taken of the gastrostomy tube.  The  patient tolerated the procedure well.   IMPRESSION:  1. Placement of 24-[place] scientific push gastrostomy.  2. Normal upper endoscopy, esophagus, stomach, and duodenum.   PLAN:  A routine orders have been written for tube feedings, antireflux  measures, as well as skin care.     Hedwig Morton. Juanda Chance, MD  Electronically Signed    DMB/MEDQ  D:  11/24/2008  T:  11/25/2008  Job:  [id number]   cc:   Marlan Palau, M.D.

## 2010-11-22 NOTE — Op Note (Signed)
Zachary Mcbride, Zachary Mcbride                 ACCOUNT NO.:  [id number]   MEDICAL RECORD NO.:  [id number]          PATIENT TYPE:  [facility]   LOCATION:  5704                         FACILITY:  [facility]   PHYSICIAN:  Felipa Evener, MD  DATE OF BIRTH:  [birth year]   DATE OF PROCEDURE:  DATE OF DISCHARGE:                               OPERATIVE REPORT   PROCEDURE PERFORMED:  Fiberoptic bronchoscopy with bronchoalveolar  lavage.   INDICATION FOR PROCEDURE:  Left lower lobe collapse, increased  secretion.   After explaining the risks and benefits of the procedure to the patient  and his wife including hemothorax, pneumothorax, vocal cord trauma, drug  reaction, the patient's wife signed informed consent.   Bronchoscopy was performed in the patient's room, 5704.  After giving  the patient 2 mg IV Versed and 100 mcg of IV fentanyl, bronchoscope was  passed through the patient's trachea to the carina.  With 10 mL of 1%  lidocaine instilled into the carina, the bronchoscope was then passed  down the right mainstem bronchus.  Upper, middle, and lower lobe all  appear within normal limits.  Clear secretions were all suctioned.  No  evidence of tracheomalacia.  No evidence of purulence.  Bronchoscope was  withdrawn and passed down the left mainstem bronchus.  Left upper lobe,  lingula, and lower lobe all appeared within normal limits.  There was  increased secretion of the left lower lobe that was all removed.  Again,  no evidence of purulence, but thickened secretions were noted that were  all removed.  The bronchoscope was wedged in the superior segment of the  left lower lobe, and BAL was performed of the area.  All secretions were  suctioned.  Bronchoscope was withdrawn through the carina and outside  the patient.  The patient tolerated the procedure well with no  complication.  Samples will be sent for bacterial, fungal, AFB stain,  and culture as well as cell count with differential.      Felipa Evener, MD  Electronically Signed     WJY/MEDQ  D:  12/30/2008  T:  12/31/2008  Job:  [id number]

## 2010-11-22 NOTE — Op Note (Signed)
NAMEKIANO, TERRIEN                 ACCOUNT NO.:  [id number]   MEDICAL RECORD NO.:  [id number]          PATIENT TYPE:  INP   LOCATION:  3103                         FACILITY:  [facility]   PHYSICIAN:  Nelda Bucks, MD DATE OF BIRTH:  1966/05/24   DATE OF PROCEDURE:  11/23/2008  DATE OF DISCHARGE:                               OPERATIVE REPORT   PROCEDURE:  Percutaneous tracheostomy.   Consent was obtained from the wife fully aware of risks and benefits of  the procedure.   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   POSTOPERATIVE DIAGNOSIS:  Status post tracheostomy for prolonged  ventilator support.   SURGEON:  Mcarthur Rossetti. Tyson Alias, MD   FIRST ASSISTANT:  Kalman Shan, MD.   BRONCHOSCOPIST:  Coralyn Helling, MD   DESCRIPTION OF PROCEDURE:  The patient's coagulation panel and platelet  count were all within normal limits preoperatively.  The patient was  placed in a supine position.  Chlorhexidine preparation was used to  sterilize the operative site.  The patient's ventilator rate was  increased to 20 as sedation was required in the form of propofol,  Versed, and fentanyl with some slight reduction in the patient's  respiratory rate spontaneously.  A 7 mL of lidocaine 1% was injected  into the operative site at the third endotracheal space.  Bronchoscopist  placed the bronchoscope through the endotracheal tube and pulled the ET  tube back to approximately 20 cm.  A 1.2 cm vertical incision was made  over the operative site and dissection was made down the tracheal  planes.  An 18-gauge white catheter sheath over a needle was introduced  in the airway successfully and noted not to have any posterior wall  injury noted by the bronchoscopist to be placed correctly.  The needle  was removed.  White catheter sheath remained.  Wire was placed through  the white catheter sheath and the white catheter sheath was removed.  A  14-French  punch dilator over the wire was placed in the airway  successfully and then removed.  A 37-French progressive dilator Rhino  brand over a glider was placed over the wire successfully.  The dilator  was removed and then a size 8 tracheostomy over a 28-French dilator over  the glider wire placed into the airway.  There was slight resistance  after about 1 cm being entering the airway, thought to be secondary to  need for further dilatation.  The tracheostomy was then removed and the  dilator of 28-French of course was removed.  The glider and wire  remained as the patient was re-dilated with the Rhino kit successfully.  The tracheostomy was then placed into the airway again at this time not  meeting much resistance and seen by the bronchoscopist to enter the  airway successfully.  The dilator 28-French and glider were removed.  The tracheostomy removed.  Bronchoscopist then placed the bronchoscope  into the new tracheostomy and noted the carina approximately 6 cm below.  The patient had blood loss of less than 1 mL.  Sutures were placed x4 on  a monofilament 3-0 on the tracheostomy.  The patient tolerated the  procedure well without any desaturation or  hemodynamic instability.  The vent rate remained to be at 20 for the  procedure.  Postoperative chest x-ray revealed tracheostomy with no  apparent complications.  There was no apparent pneumothorax noted and  the tracheostomy tube appeared to be approximately 5 cm above the  carina.  The patient tolerated the procedure well.      Nelda Bucks, MD  Electronically Signed     DJF/MEDQ  D:  11/23/2008  T:  11/24/2008  Job:  [id number]   cc:   Kalman Shan, MD

## 2010-11-22 NOTE — Assessment & Plan Note (Signed)
[facility]                            CARDIOLOGY OFFICE NOTE   Zachary Mcbride, Zachary Mcbride                        MRN:          [id number]  DATE:01/23/2008                            DOB:          07/18/1965    Zachary Mcbride is a 42-year patient who had add on to the DOD schedule for  chest pain.  The patient has been having chest pain for about 6 months,  has been worse over the last 6-8 weeks.  Pain is atypical and is  nonexertional.  It can occur anytime during the day.  It is described as  sharp at left side of the chest radiating towards left shoulder.  It  never lasts more than a minute of two.  He does not know anything that  will make it go away quicker.  There are no exacerbating factors.  There  is no GI overtones, nausea, or vomiting.  He has not had pleuritic pain  or cough.  He is a chronic smoker, smoking at times close to 3 packs a  day.   He has not had a previous stress test.  The patient's coronary risk  factors include smoking and family history for coronary disease.   I do not have a lipid status on and he is a nondiabetic and  nonhypertensive.   REVIEW OF SYSTEMS:  Otherwise negative.   PAST MEDICAL HISTORY:  Benign.  He has never been in the hospital.  He  does get occasional diaphoresis with his chest pain.  There is no  lightheadedness or palpitations.   The patient smokes at least 2 packs per day.  He does not drink.   He works at [employer].  He is married with the  children.  He is otherwise fairly sedentary.  He smokes 2 packs a day  and does not drink.  He has not had any previous surgeries.   FAMILY HISTORY:  Remarkable for mother being alive with a history of  cervical cancer.  Father had a heart attack at young age.   He is on no chronic medications and denies any allergies.   PHYSICAL EXAMINATION:  GENERAL:  Exam is remarkable for a middle-aged  white male who looks older than his stated age.  He  reeks of nicotine.  Affect appropriate.  VITAL SIGNS:  His weight is 168, blood pressure is 136/85, pulse 87 and  regular, respiratory rate 14, and afebrile.  HEENT:  Unremarkable.  He has poor dentition.  NECK:  Carotids are normal without bruit.  No lymphadenopathy,  thyromegaly, or JVP elevation.  LUNGS:  Clear.  Good diaphragmatic motion.  No wheezing.  CARDIAC:  S1 and S2.  Normal heart sounds.  PMI normal.  ABDOMEN:  Benign.  Bowel sounds positive.  No AAA.  No tenderness.  No  hepatosplenomegaly.  No hepatojugular reflux.  No bruit.  EXTREMITIES:  Distal pulses are intact.  No edema.  NEUROLOGIC:  Nonfocal.  SKIN:  Warm and dry.   EKG shows sinus rhythm and nonspecific ST-T wave changes.   IMPRESSION:  1. Atypical  chest pain.  Nonspecific EKG changes.  Followup stress      Myoview.  2. Poor dentition.  I encouraged him to follow up with dentist.  He      appears to have significant gingival disease, which could      predispose him towards infection.  3. 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  Consider anti-inflammatory or Motrin p.r.n.  4. Smoking cessation.  I counseled the patient of less than 10 minutes      regarding smoking cessation.  He has not tried to quit in the last      10 years.  Long-term side effects in regards to lung cancer and      vascular disease were discussed.  I will leave it up to Dr. Artis Flock      to see if he wants to try to put him on Chantix.  Further      recommendations will be based on the results of his stress test.     Theron Arista C. Eden Emms, MD, [facility]  Electronically Signed    PCN/MedQ  DD: 01/23/2008  DT: 01/24/2008  Job #: [id number]   cc:   Quita Skye. Artis Flock, M.D.

## 2010-11-22 NOTE — Op Note (Signed)
NAME:  Zachary Mcbride, Zachary Mcbride NO.:  [id number]   MEDICAL RECORD NO.:  [id number]           PATIENT TYPE:   LOCATION:                                 FACILITY:   PHYSICIAN:  Marlan Palau, M.D.  DATE OF BIRTH:  26-Jun-1966   DATE OF PROCEDURE:  11/20/2008  DATE OF DISCHARGE:                               OPERATIVE REPORT   HISTORY:  This is a 45 year old gentleman with progressive weakness of  all 4 extremities and face who is being evaluated for possible Guillain-  Barre syndrome.  Lumbar puncture was performed as part of the diagnostic  workup of this disease entity.   Lumbar puncture was performed with the patient in the fetal position on  the left side.  Low back was cleaned with Betadine solution and  approximately 2 mL of 1% Xylocaine was used with local anesthetic.  A 20-  gauge spinal needle was inserted at L3-4 interspace and approximately 14  mL of very minimally xanthochromic fluid was removed for testing.  Opening pressure was 380 mm of water.  Tube #1 was sent for VDRL;  cryptococcal antigen; and angiotensin-converting enzyme level 2, tube #2  was sent for cytology; tube #3 was sent for cells, differential,  glucose, and protein; tube #4 was sent for Lyme antibody panel.  There  were no complication to above procedure.  The patient tolerated  procedure well.      Marlan Palau, M.D.  Electronically Signed     CKW/MEDQ  D:  11/20/2008  T:  11/20/2008  Job:  [id number]   cc:   [facility]

## 2010-11-25 ENCOUNTER — Inpatient Hospital Stay (INDEPENDENT_AMBULATORY_CARE_PROVIDER_SITE_OTHER)
Admission: RE | Admit: 2010-11-25 | Discharge: 2010-11-25 | Disposition: A | Discharge status: Home / Self Care | Payer: Self-pay | Source: Ambulatory Visit | Attending: Family Medicine | Admitting: Family Medicine

## 2010-11-25 DIAGNOSIS — L923 Foreign body granuloma of the skin and subcutaneous tissue: Secondary | ICD-10-CM

## 2011-01-05 IMAGING — CR DG CHEST 1V PORT
1 series · 1 of 1 positions shown · non-contrast
Comparison: 11/27/2008

CLINICAL DATA: Ventilator.

PORTABLE CHEST - 1 VIEW

[AP]
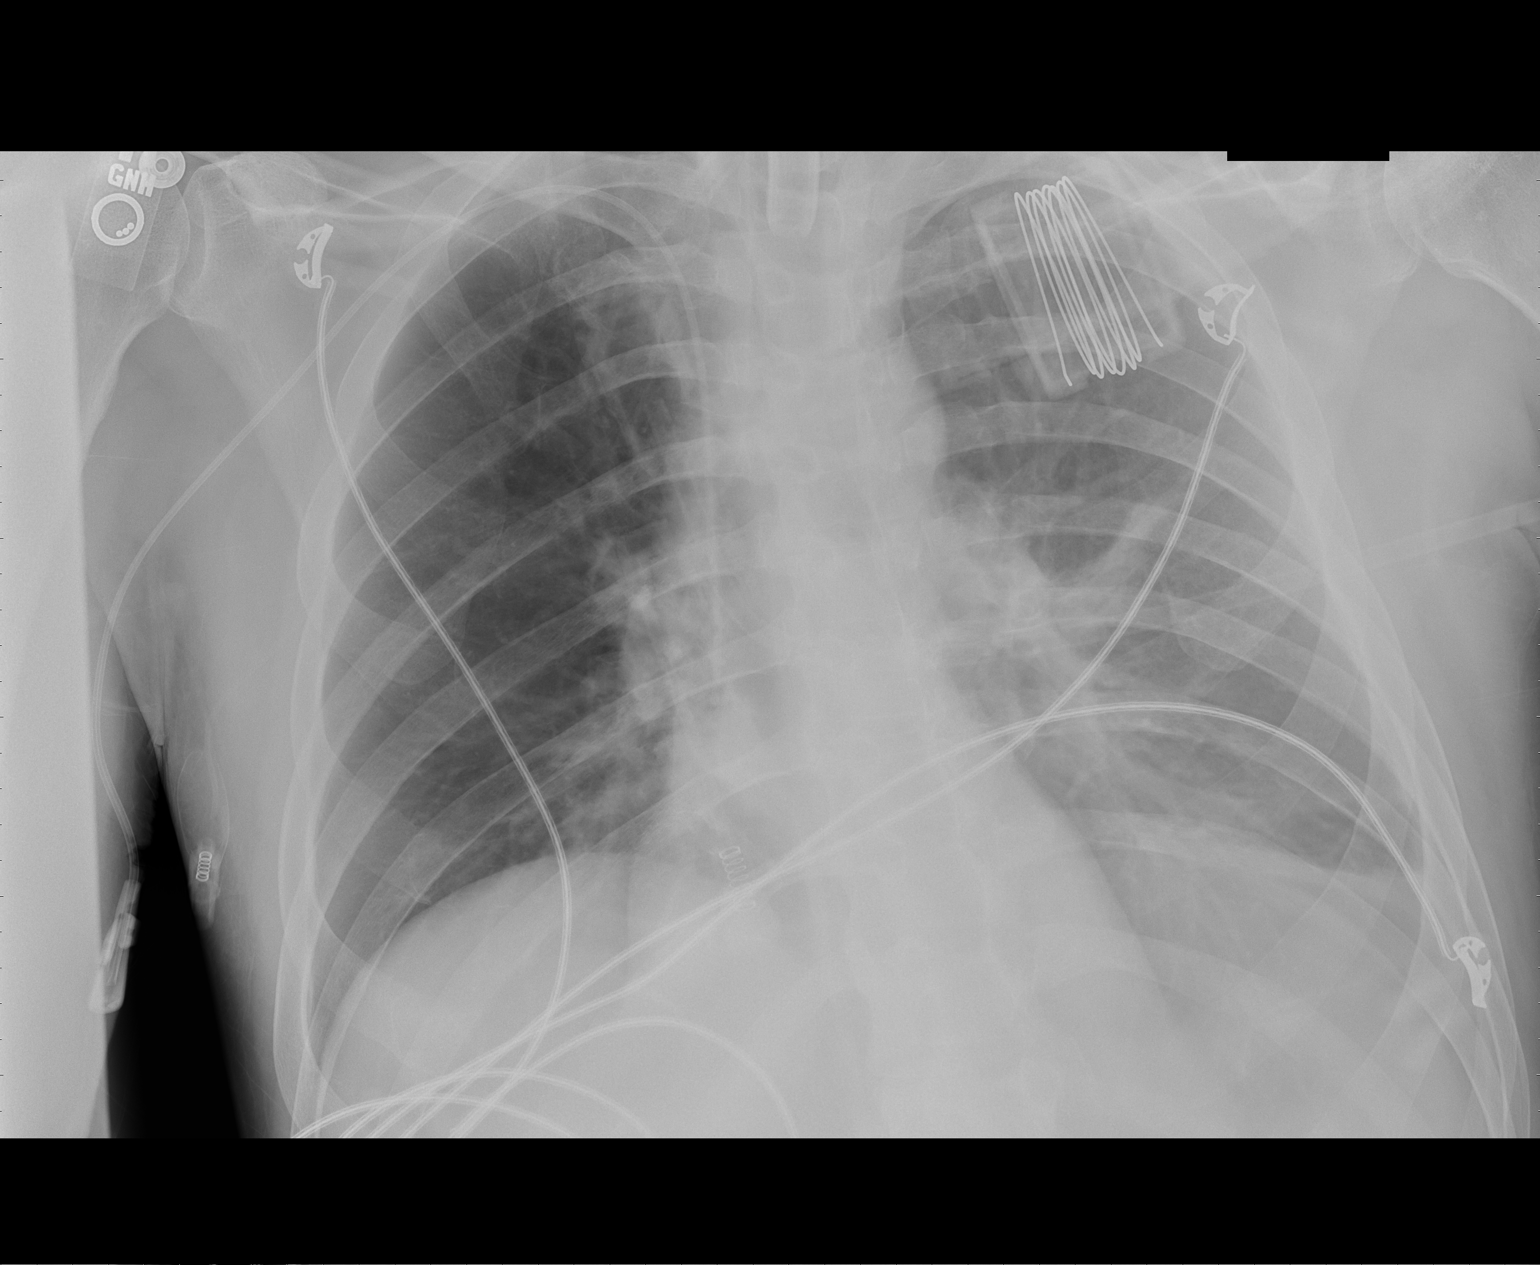

[1 of 1 positions shown; findings below may reference images not displayed]

FINDINGS: Right PICC line and tracheostomy tube unchanged.
Improving bibasilar atelectasis.  Mild residual left base
atelectasis.  Possible small left effusion.  Heart is upper limits
normal in size.
IMPRESSION: Improving bibasilar atelectasis.  Mild residual left base
atelectasis and possible small left effusion.

## 2011-01-08 IMAGING — CT CT ABDOMEN W/ CM
2 of 5 series · 17 of 46 positions shown, 19 images · IV contrast (APPLIED)
Comparison: None

CT CHEST

CLINICAL DATA: Guillain Barre syndrome, fever, respiratory
failure, on ventilator, question abscess

CT CHEST, ABDOMEN AND PELVIS WITH CONTRAST
TECHNIQUE: Multidetector CT imaging of the chest, abdomen and
pelvis was performed following the standard protocol during bolus
administration of intravenous contrast. Breast shield utilized.
Sagittal and coronal MPR images reconstructed from axial data set.
Contrast: 80 ml Qmnipaque-MXX IV and dilute oral contrast

[Series 2: c/a/p 5.0 b31f · axial · 0.67mm/px · z∈[-428,+167]mm · 14 of 135 slices shown, 16 images]
[im 8/135  soft-tissue]
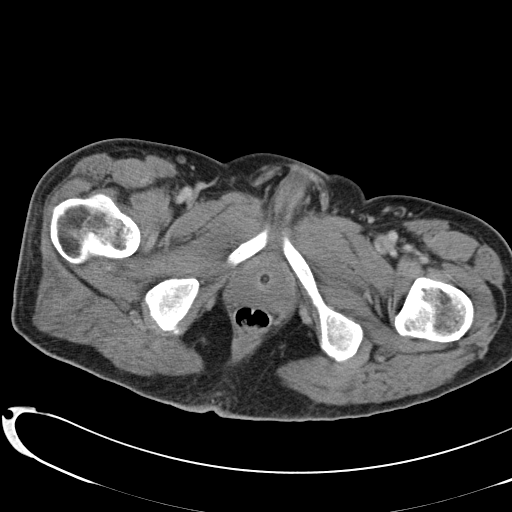
[im 8/135  bone]
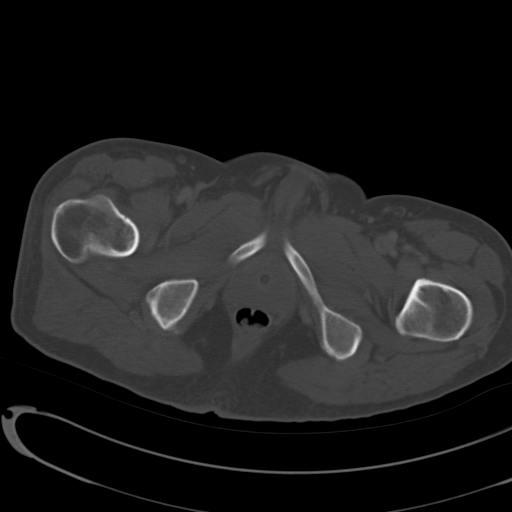
[im 15/135  soft-tissue]
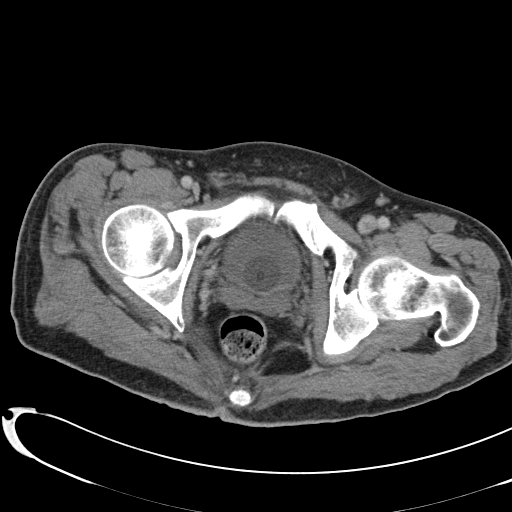
[im 30/135  soft-tissue]
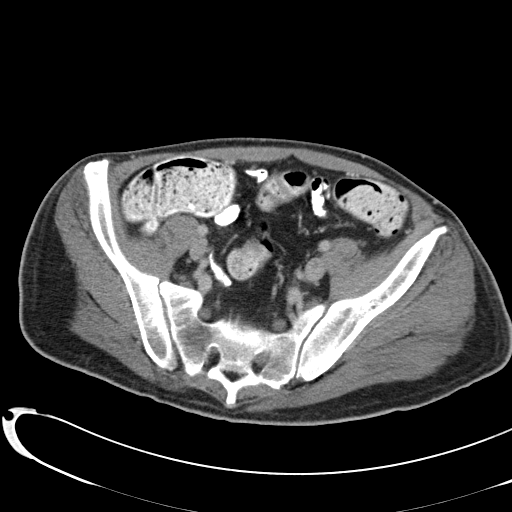
[im 38/135  soft-tissue]
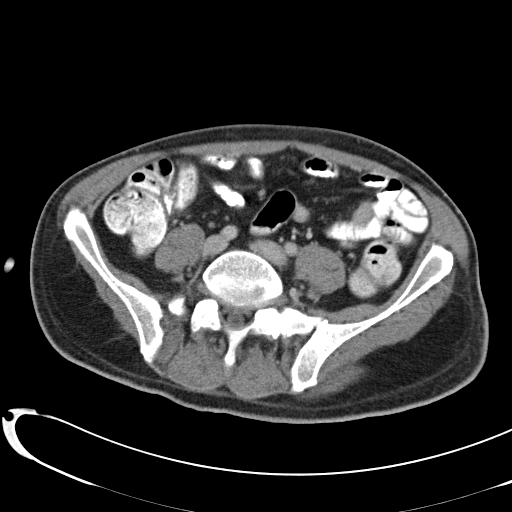
[im 45/135  soft-tissue]
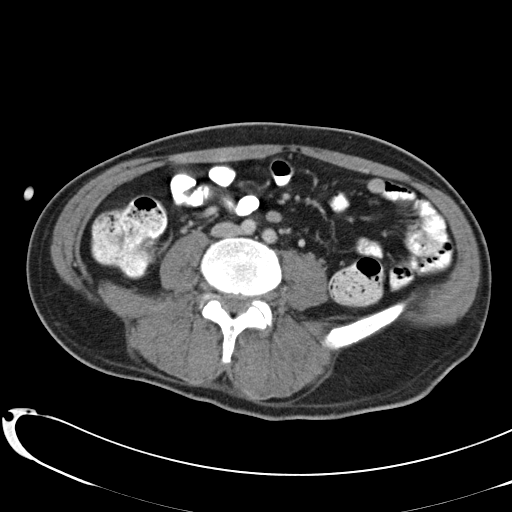
[im 53/135  soft-tissue]
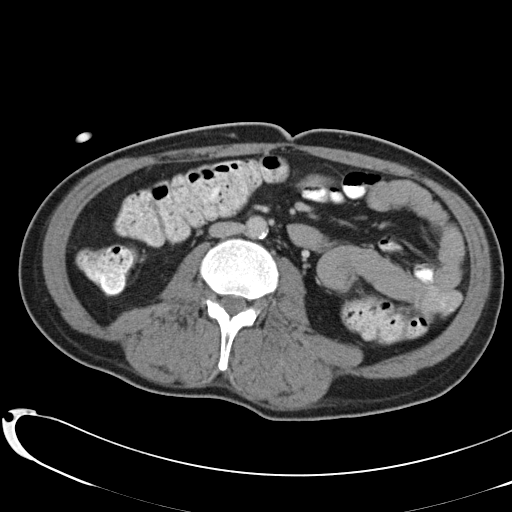
[im 60/135  soft-tissue]
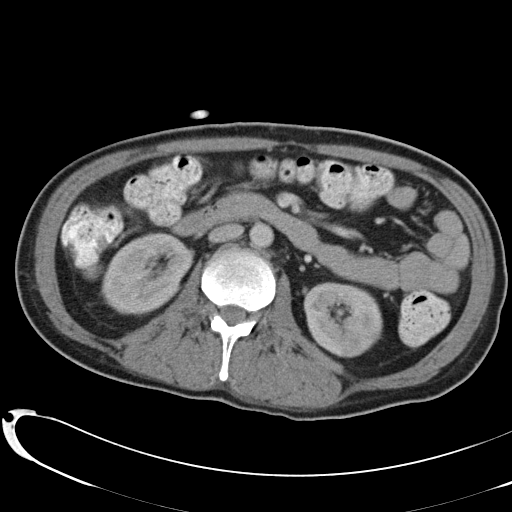
[im 75/135  soft-tissue]
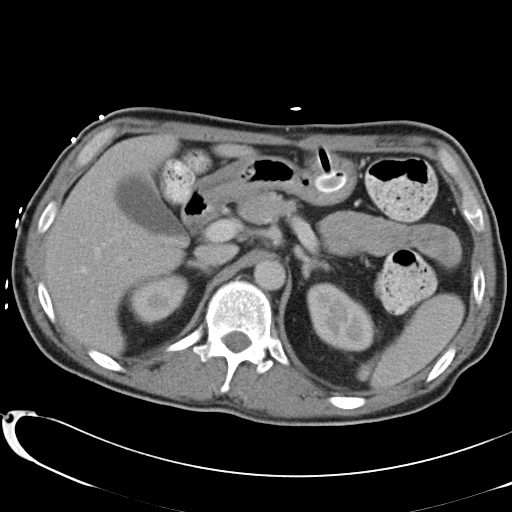
[im 82/135  soft-tissue]
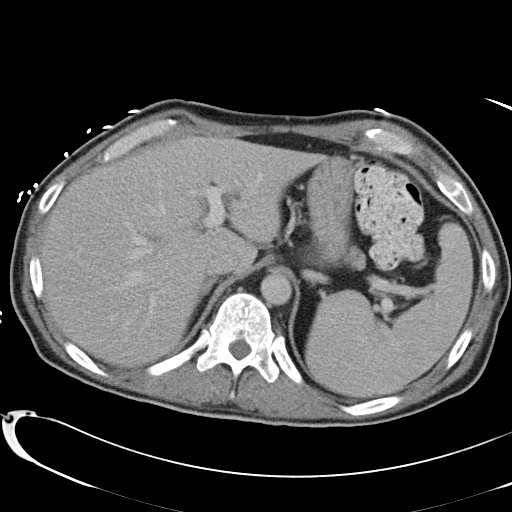
[im 82/135  bone]
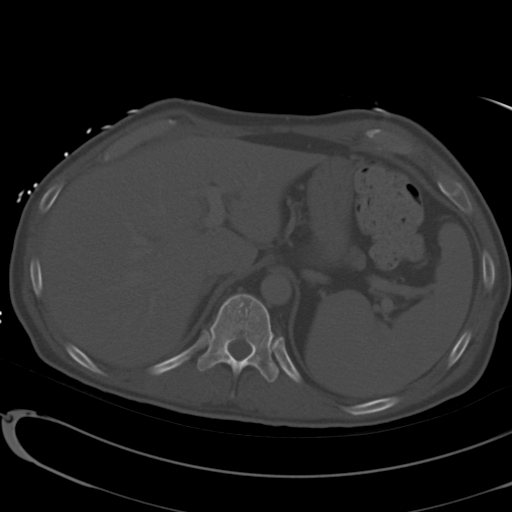
[im 90/135  soft-tissue]
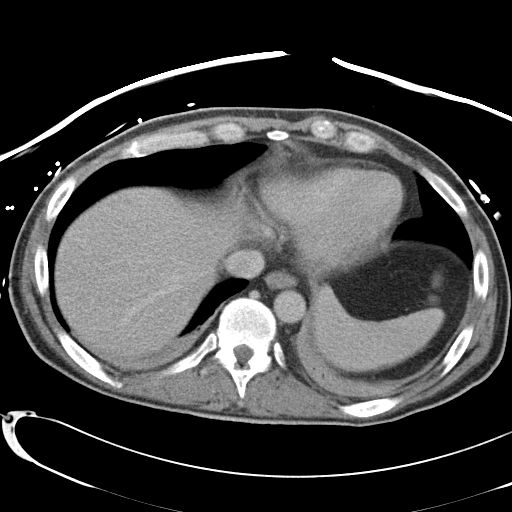
[im 97/135  soft-tissue]
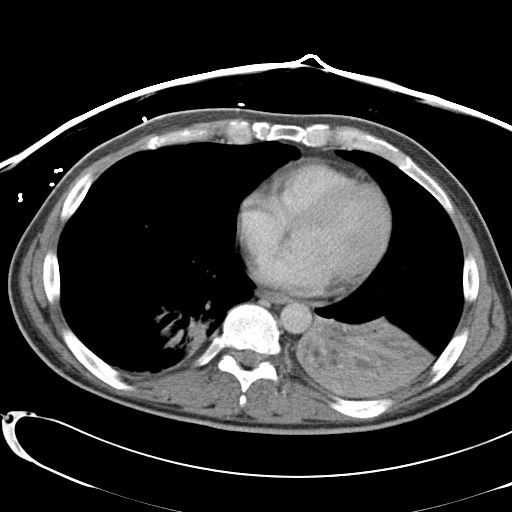
[im 105/135  soft-tissue]
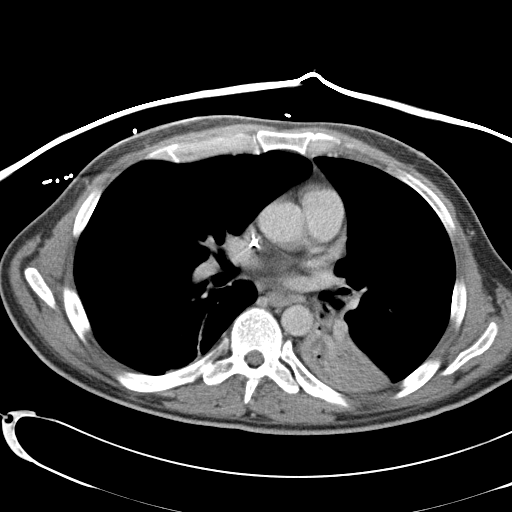
[im 120/135  soft-tissue]
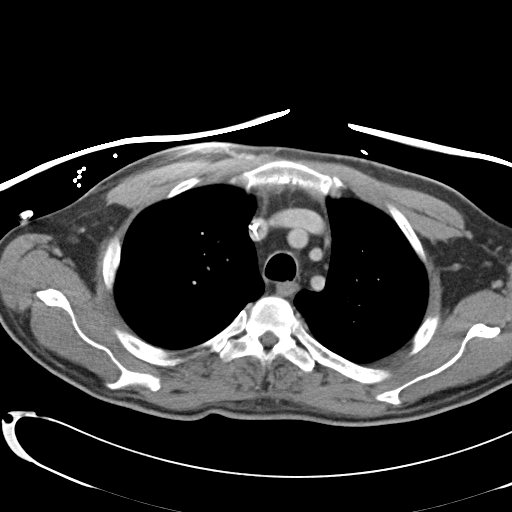
[im 127/135  soft-tissue]
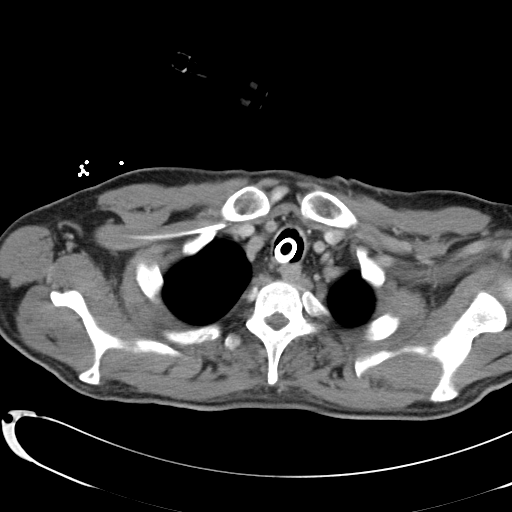

[Series 5: c/a/p · coronal · 1.29mm/px · 3 of 103 slices shown]
[im 35/103  soft-tissue]
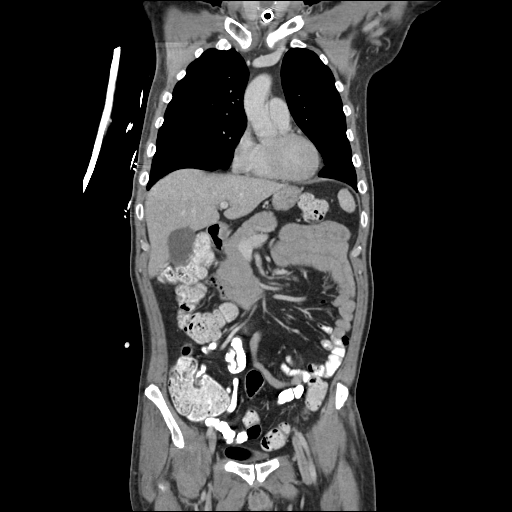
[im 46/103  soft-tissue]
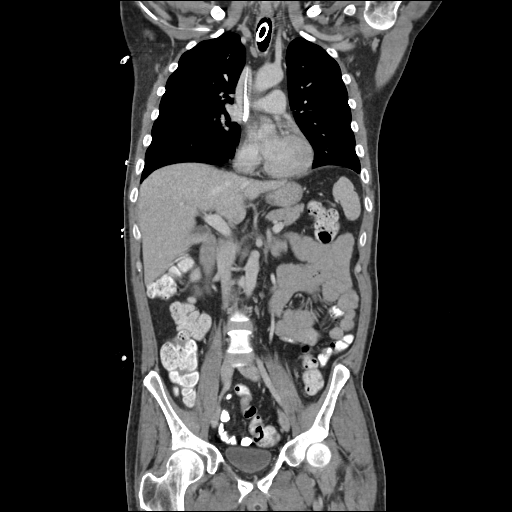
[im 57/103  soft-tissue]
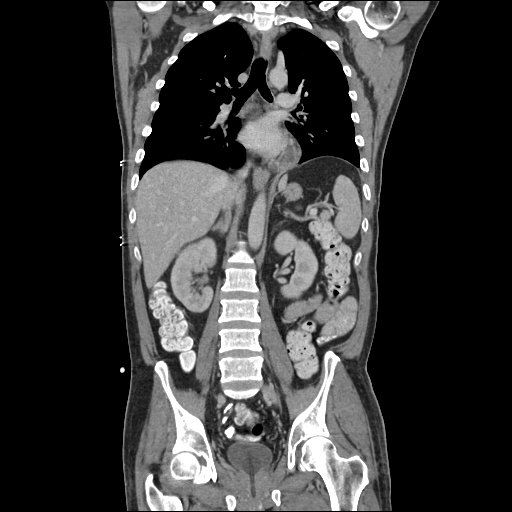

[17 of 46 positions shown; findings below may reference images not displayed]

FINDINGS: Tracheostomy tube in trachea.
Small fluid within trachea above the tracheostomy tube.
Scattered normal-sized thoracic nodes without adenopathy.
Thoracic vascular structures grossly patent.
Complete collapse of left lower lobe.
Partial atelectasis and question infiltrate right lower lobe.
Remaining lungs clear.
IMPRESSION: Complete atelectasis/collapse of left lower lobe.
Atelectasis with potential additional infiltrate in right lower
lobe.

CT ABDOMEN
FINDINGS: Gastrostomy tube in stomach.
Probable splenules of splenic hilum.
Single tiny nonspecific low attenuation focus lateral segment left
lobe liver image 55, too small to characterize.
Remainder of liver, spleen, pancreas, kidneys, and adrenal glands
normal.
Large and small bowel loops in upper abdomen normal.
No mass, adenopathy, or free fluid.
Bones unremarkable.
IMPRESSION: No acute upper abdominal abnormalities.

CT PELVIS
FINDINGS: Large and small bowel loops in pelvis normal.
Foley catheter in urinary bladder.
Mild prostatic enlargement, gland 5.3 x 3.9 cm image 125.
No pelvic mass, adenopathy, free fluid, or inflammatory process.

Normal appendix.
No pelvic bony abnormalities.
IMPRESSION: No acute intrapelvic abnormalities.

## 2011-01-10 IMAGING — CR DG CHEST 1V PORT
1 series · 1 of 1 positions shown · non-contrast
Comparison: 12/02/2008

CLINICAL DATA: Atelectasis.

PORTABLE CHEST - 1 VIEW

[view not recorded]
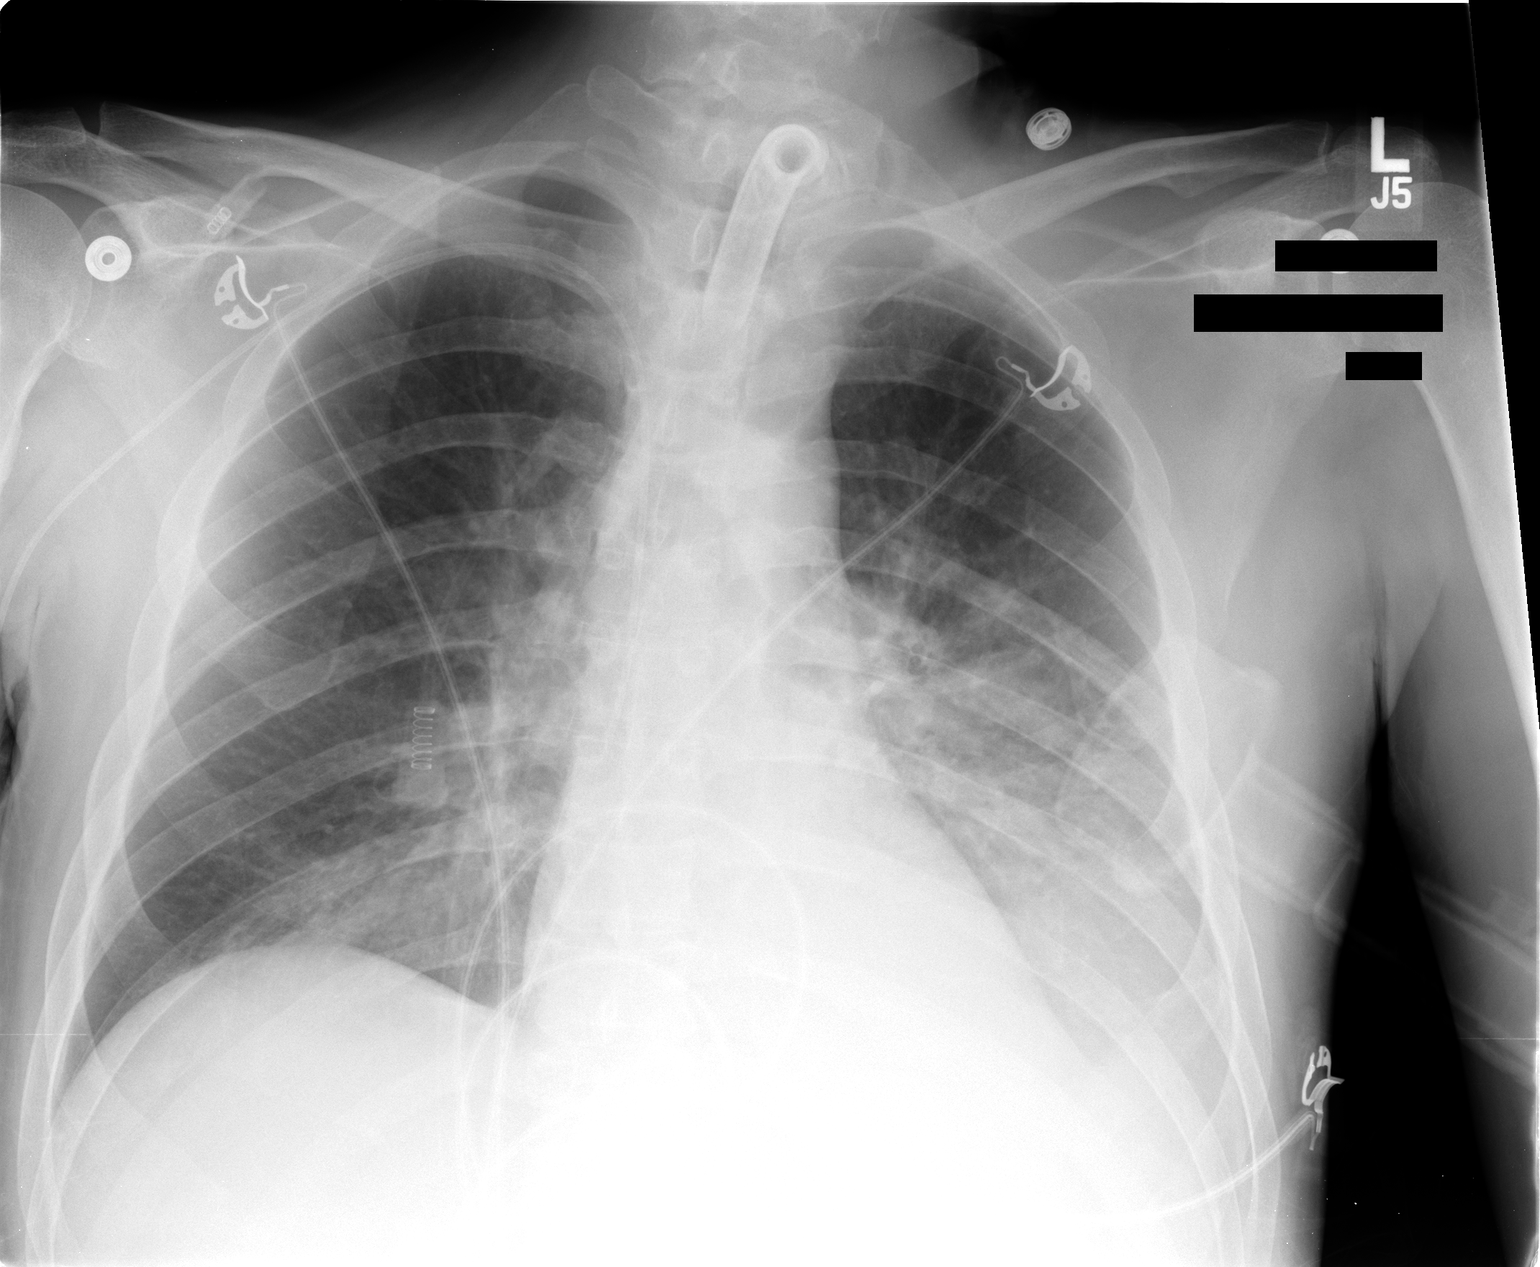

[1 of 1 positions shown; findings below may reference images not displayed]

FINDINGS: PICC line and tracheostomy tube unchanged.  Increasing
bilateral lower lobe airspace opacity, left greater than right.
This is concerning for pneumonia.  Heart is upper limits normal in
size.  Suspect small layering left pleural effusion.
IMPRESSION: Increasing bilateral lower lobe airspace disease, left greater than
right, concerning for pneumonia.

Suspect small left effusion.

## 2011-01-15 ENCOUNTER — Emergency Department (HOSPITAL_COMMUNITY): Payer: Self-pay

## 2011-01-15 ENCOUNTER — Emergency Department (HOSPITAL_COMMUNITY)
Admission: EM | Admit: 2011-01-15 | Discharge: 2011-01-15 | Disposition: A | Discharge status: Home / Self Care | Payer: Self-pay | Attending: Emergency Medicine | Admitting: Emergency Medicine

## 2011-01-15 DIAGNOSIS — M20099 Other deformity of finger(s), unspecified finger(s): Secondary | ICD-10-CM | POA: Insufficient documentation

## 2011-01-15 DIAGNOSIS — IMO0002 Reserved for concepts with insufficient information to code with codable children: Secondary | ICD-10-CM | POA: Insufficient documentation

## 2011-01-15 DIAGNOSIS — W108XXA Fall (on) (from) other stairs and steps, initial encounter: Secondary | ICD-10-CM | POA: Insufficient documentation

## 2011-01-19 ENCOUNTER — Encounter: Payer: Self-pay | Attending: Physical Medicine & Rehabilitation | Admitting: Neurosurgery

## 2011-01-19 DIAGNOSIS — G894 Chronic pain syndrome: Secondary | ICD-10-CM

## 2011-01-19 DIAGNOSIS — G610 Guillain-Barre syndrome: Secondary | ICD-10-CM | POA: Insufficient documentation

## 2011-01-20 NOTE — Assessment & Plan Note (Signed)
HISTORY OF PRESENT ILLNESS:  Mr. Stroschein is a patient of Dr. Riley Kill that is seen for constant pain syndrome related to his Guillain-Barr syndrome.  He is wearing his leg braces today.  He reports no change in his condition.  He averages his pain as 3.  It is a sharp burning pain that is tingling.  Pain is worse in the morning.  Sleep patterns are fair.  Any activities tend to aggravate.  Rest and medications tend to help.  He walks without assistance, does have somewhat of a limp but he can climb steps and drive.  On functionality, he is on disability.  REVIEW OF SYSTEMS:  Notable for those difficulties as described above, otherwise within normal limits.  No suicidal thoughts or aberrant behaviors.  PAST MEDICAL HISTORY:  Unchanged.  SOCIAL HISTORY:  She is married, lives with his wife.  FAMILY HISTORY:  Unchanged.  PHYSICAL EXAMINATION:  VITAL SIGNS:  His blood pressure is 124/84, pulse 96, respirations 18, and O2 sats 98 on room air. NEUROLOGIC:  His motor strength is fair in his lower extremities about 4/5.  He does still have contractures.  He has fractured right hand after a fall into his garage.  He fractured his last two fingers and dislocated and bruised his middle finger.  He has a brace on that.  He was treated at Urgent Care, otherwise his sensation appears to be intact.  He is walking with his AFOs and has no problems there. GENERAL:  Constitutionally, he is within normal limits, alert and oriented x3.  His affect is bright.  ASSESSMENT: 1. History of Guillain-Barr. 2. Chronic pain. 3. New fractures, right hand.  PLAN: 1. I will refill his Norco 10/325 one p.o. q.6 h. p.r.n. 90 with no     refill. 2. Fentanyl patch 75 mcg 1 transdermally every 72 hours, 10 with no     refill. 3. I will continue his other medicines as prescribed.  He is doing     well with that. 4. He will follow up in the clinic in 2 months.  His questions were     encouraged and  answered.     Coe Angelos L. Blima Dessert Electronically Signed    RLW/MedQ D:  01/19/2011 13:51:43  T:  01/20/2011 00:10:00  Job #:  [id number]

## 2011-03-16 ENCOUNTER — Encounter: Payer: Self-pay | Attending: Neurosurgery | Admitting: Neurosurgery

## 2011-03-16 DIAGNOSIS — G894 Chronic pain syndrome: Secondary | ICD-10-CM | POA: Insufficient documentation

## 2011-03-16 DIAGNOSIS — G610 Guillain-Barre syndrome: Secondary | ICD-10-CM

## 2011-03-16 NOTE — Assessment & Plan Note (Signed)
HISTORY:  This patient was seen by Dr. Riley Kill, for history of Guillain- Barre syndrome.  He has leg AFOs on today that he wants to have cut down.  He states he will have his Medicate back in November and he is going to ask Dr. Riley Kill, and refer him to get at least braces probably adjusted or either changed to differently length.  He reports no change in his pain today, it is 2 or 3, described as burning, tingling, and aching type pain.  He is independent with his ambulation.  General activity level is 3.  Pain is worse in the morning and night.  Walking and activity, and activity aggravate.  Rest and therapy medication tends to help.  He can walk about 30 minutes time.  He climb steps and drives. He is on disability.  REVIEW OF SYSTEMS:  Notable for difficulties as described above as well as some night sweats, constipation, and paresthesias.  PAST MEDICAL HISTORY:  Unchanged.  SOCIAL HISTORY:  Married, lives with his wife.  FAMILY HISTORY:  Unchanged.  PHYSICAL EXAMINATION:  VITAL SIGNS:  Blood pressure 122/88, pulse 96, respirations 19, and O2 sats 98 on room air. MUSCULOSKELETAL:  His motor strength is 4-4 to 1/2/5 on the lower extremities and mild contractures.  He does have some pain in his right hand after a fall and break in his fingers sometime ago.  Sensation is mildly diminished in upper and lower extremities.  Constitutionally, he is within normal limits.  He is alert and oriented x3.  ASSESSMENT: 1. History of Guillain-Barre. 2. Chronic pain syndrome. 3. Healed right hand fractures.  PLAN: 1. Refill Norco 10/325 one p.o. q.6 h. p.r.n. 90 with no refill. 2. Fentanyl patch 75 mcg 1 transdermal every 72 hours 10 with no     refill. 3. He will follow up with Dr. Riley Kill in 2 months.  At that time, he     should have his Medicaid back and he wants to get his braces adjust     and discuss that option with Dr. Riley Kill.     Zachary Mcbride L. Zachary Mcbride     [name], M.D. Electronically Signed   RLW/MedQ D:  03/16/2011 11:35:42  T:  03/16/2011 15:03:16  Job #:  [id number]

## 2011-05-16 ENCOUNTER — Encounter: Payer: Self-pay | Admitting: Physical Medicine & Rehabilitation

## 2011-06-16 ENCOUNTER — Encounter
Payer: PRIVATE HEALTH INSURANCE | Attending: Physical Medicine & Rehabilitation | Admitting: Physical Medicine & Rehabilitation

## 2011-06-16 DIAGNOSIS — F329 Major depressive disorder, single episode, unspecified: Secondary | ICD-10-CM

## 2011-06-16 DIAGNOSIS — G8929 Other chronic pain: Secondary | ICD-10-CM | POA: Insufficient documentation

## 2011-06-16 DIAGNOSIS — G894 Chronic pain syndrome: Secondary | ICD-10-CM

## 2011-06-16 DIAGNOSIS — G610 Guillain-Barre syndrome: Secondary | ICD-10-CM | POA: Insufficient documentation

## 2011-06-16 DIAGNOSIS — IMO0002 Reserved for concepts with insufficient information to code with codable children: Secondary | ICD-10-CM

## 2011-06-16 NOTE — Assessment & Plan Note (Signed)
Zachary Mcbride is back regarding his Guillain-Barre syndrome and chronic pain.  He has been generally doing fairly well except he reports some increased left leg and foot pain in the mornings.  He has been a lot more active. He notes that his AFOs are quite as dynamic as he would hope to be and he would like to have some more freedom in his ankle.  He also notes that the proximal portion cuts into his shin and calf, particularly when he is bending and more active outside.  Pain is about a 4/10.  He can walk about 20-30 minutes at a time without stopping.  REVIEW OF SYSTEMS:  Notable for dizziness, nausea, vomiting, shortness of breath.  Full 12-point review is in the written health and history section of the chart.  SOCIAL HISTORY:  Unchanged.  PHYSICAL EXAMINATION:  VITAL SIGNS:  Blood pressure is 147/85, pulse 104, respiratory rate is 16 and he is satting 98% on room air. GENERAL:  The patient is pleasant, alert. EXTREMITIES:  No changes are noted in his physical exam except perhaps a bit of dorsiflexion of the toes.  Ankle dorsiflexion, plantar flexion remains 0.  He has diminished sensation in both legs and a stocking- glove distribution distal more than proximal.  Plantar flexion is 3-4/5 bilaterally.  Knee extension flexion is 4-5/5.  Hip flexion is 5/5. Upper extremity strength is grossly 4-5/5 with some chronic changes noted in the hand intrinsics. NEUROLOGIC:  Cognitively, he is alert and appropriate.  Behavior and mood is pleasant as always.  ASSESSMENT:  History of Guillain-Barre with chronic pain and motor and sensory changes.  PLAN: 1. I will send the patient to advanced for modification of his AFOs.     They maybe able to mold these proximally to give some more relief     to the skin.  These are not the type of braces that can be hinged,     but they could possibly talk about modifications or changing braces     to meet his lifestyle. 2. We will increase his nortriptyline to  50 mg at bedtime for pain and     sleep to help some of the morning symptoms 3. We will stay with the fentanyl patch 75 mcg for now with the Norco     10/325 one q.6 h. p.r.n.  #90 were written today.  We will discuss     potential conversion to another long-acting agent this winter.  The     fentanyl patches do cause some skin irritation.  There maybe a     better option for him such as Opana that could be effective for his     type of pain.  He will see my nurse practitioner back in about 2     months.  I gave 2 months worth of prescriptions today.     [name], M.D. Electronically Signed    ZTS/MedQ D:  06/16/2011 10:08:51  T:  06/16/2011 12:05:20  Job #:  [id number]

## 2011-08-14 ENCOUNTER — Encounter: Payer: Self-pay | Admitting: Neurosurgery

## 2011-12-11 ENCOUNTER — Ambulatory Visit: Payer: PRIVATE HEALTH INSURANCE | Admitting: Physical Medicine & Rehabilitation

## 2012-09-04 ENCOUNTER — Encounter
Payer: PRIVATE HEALTH INSURANCE | Attending: Physical Medicine & Rehabilitation | Admitting: Physical Medicine & Rehabilitation

## 2012-10-11 ENCOUNTER — Encounter: Payer: Self-pay | Admitting: Physical Medicine & Rehabilitation

## 2012-10-11 ENCOUNTER — Encounter
Payer: PRIVATE HEALTH INSURANCE | Attending: Physical Medicine & Rehabilitation | Admitting: Physical Medicine & Rehabilitation

## 2012-10-11 VITALS — BP 168/103 | HR 86 | Resp 14 | Ht 66.0 in | Wt 203.0 lb

## 2012-10-11 DIAGNOSIS — R29898 Other symptoms and signs involving the musculoskeletal system: Secondary | ICD-10-CM | POA: Insufficient documentation

## 2012-10-11 DIAGNOSIS — IMO0002 Reserved for concepts with insufficient information to code with codable children: Secondary | ICD-10-CM

## 2012-10-11 DIAGNOSIS — M792 Neuralgia and neuritis, unspecified: Secondary | ICD-10-CM

## 2012-10-11 DIAGNOSIS — R209 Unspecified disturbances of skin sensation: Secondary | ICD-10-CM | POA: Insufficient documentation

## 2012-10-11 DIAGNOSIS — G610 Guillain-Barre syndrome: Secondary | ICD-10-CM | POA: Insufficient documentation

## 2012-10-11 DIAGNOSIS — G8929 Other chronic pain: Secondary | ICD-10-CM | POA: Insufficient documentation

## 2012-10-11 NOTE — Progress Notes (Signed)
Subjective:    Patient ID: Zachary Mcbride, male    DOB: [birth year], 47 y.o.   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 He continues to have weakness and numbness in his hands and feet. He has associated dysesthesias which are persistent.  He had some new AFO's constructed by [employer] and they are not fitting appropriately with the bands frequently moving and loosening with fit. When they loosen he loses the stability and has problems with ankle control, loss of balance, ankle sprains etc. He also complains of poor exercise tolerance. Even the easiest of physical activities seem to put him out of breath.   At one point, he attempted to see if his work would be something he could even approach doing. 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   Despite the severe motor-sensory loss which persists from his GBS, Zachary Mcbride is able to take care of himself and perform basic selfcare tasks at home. He can do some simple house chores as well.       Pain Inventory Average Pain 5 Pain Right Now 5 My pain is sharp, burning, tingling and aching  In the last 24 hours, has pain interfered with the following? General activity 5 Relation with others 2 Enjoyment of life 2 What TIME of day is your pain at its worst? morning Sleep (in general) Poor  Pain is worse with: some activites Pain improves with: rest and medication Relief from Meds: 4  Mobility how many minutes can you walk? 15 ability to climb steps?  yes do you drive?  yes  Function Do you have any goals in this area?  no  Neuro/Psych numbness tingling trouble walking spasms dizziness anxiety  Prior Studies Any changes since last visit?  no  Physicians involved in your care Any changes since last visit?  no   History reviewed. No pertinent family history. History   Social History  . Marital Status: Married    Spouse Name: N/A    Number of Children: N/A  . Years of Education: N/A   Social History Main Topics  . Smoking status: Former Games developer  . Smokeless tobacco: Never Used  . Alcohol Use: Yes  . Drug Use: None  . Sexually Active: None   Other Topics Concern  . None   Social History Narrative  . None   Past Surgical History  Procedure Laterality Date  . Guillain-barre syndrome     Past Medical History  Diagnosis Date  . Weakness   . Depression   . Anxiety   . Trouble walking   . Numbness of extremity   . Tingling    BP 168/103  Pulse 86  Resp 14  Ht 5\' 6"  (1.676 m)  Wt 203 lb (92.08 kg)  BMI 32.78 kg/m2  SpO2 99%     Review of Systems  Constitutional: Positive for unexpected weight change.  Respiratory: Positive for shortness of breath.   Gastrointestinal: Positive for constipation.  Musculoskeletal: Positive for gait problem.  Neurological: Positive for dizziness and numbness.  Psychiatric/Behavioral: The patient is nervous/anxious.   All other systems reviewed and are negative.       Objective:   Physical Exam   General: Alert and oriented x 3, No apparent distress HEENT: Head is normocephalic, atraumatic, PERRLA, EOMI, sclera anicteric, oral mucosa pink and moist, dentition intact, ext ear canals clear,  Neck: Supple without JVD  or lymphadenopathy Heart: Reg rate and rhythm. No murmurs rubs or gallops Chest: CTA bilaterally without wheezes, rales, or rhonchi; no distress Abdomen: Soft, non-tender, non-distended, bowel sounds positive. Extremities: No clubbing, cyanosis, or edema. Pulses are 2+ Skin: Clean and intact without signs of breakdown Neuro: Pt is cognitively appropriate with normal insight, memory, and awareness. Cranial nerves 2-12 are notable for a mild left facial droop.Marland Kitchen He has stocking glove sensory loss from the distal forearm to finger tips bilaterally and from the lower calf to toes bilaterally. Sensation is less the more distal you test.  He continues to have mild wrist weakness 4-, HI weakness 3+, in the upper extremities. Has substantially diminished fine motor movement of his HI muscles.  Lower extremities reveal 4+ strength in hips and knees. Right APF is 2+ to 3, Right ADF is 0, Left APF is 2-, left ADF is 0. With his AFO's he can walk with a steppage gait pattern. He almost fell in my office while attempting to sit back in his chair. He cannot support himself safely with gait without the AFO's, the AFO's themselves fit fairly well, but the upper strap frequently comes loose because of a design flaw in the brace itself. Subsequently, the entire brace come loose from his proximal leg and he loses the support of the brace. Musculoskeletal: Full ROM, No pain with AROM or PROM in the neck, trunk, or extremities. Posture appropriate Psych: Pt's affect is appropriate. 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  PLAN:  1. I will send the patient to advanced for modification or replacement AFO's. His current braces are inappropriate in my opinion.  2. Continue narcotic mgt per Dr. Kevan Ny' office. He will require long term analgesia given the severity of his GBS.  3. Given his persistent symptom, Mr. Pina remains unable to return to any type of gainful employment---even sedentary work. These limitations will be life long.  4. I will see the patient back as needed. 30 minutes of face to face patient care time were spent during this visit. All questions were encouraged and answered.

## 2012-10-11 NOTE — Patient Instructions (Signed)
CALL ME WITH ANY PROBLEMS OR QUESTIONS (#[phone]).  HAVE A GOOD DAY  

## 2012-11-27 ENCOUNTER — Telehealth: Payer: Self-pay | Admitting: Cardiovascular Disease

## 2012-11-27 NOTE — Telephone Encounter (Signed)
2:00pm    Pt. Called no answer , unable to leave message

## 2012-11-27 NOTE — Telephone Encounter (Signed)
Patient thought he had a doppler scheduled today to "check the blood flow in his legs".  He said someone called him to schedule it but he does not remember who it was.  There is no order for a test. What is he supposed to have?

## 2012-11-27 NOTE — Telephone Encounter (Signed)
Pt. Called and informed that we would be glad to schedule him for LE Dopplers as requested by Dr. Tanya Nones . Pt stated that was ok. Joyce Gross in scheduling stated she would call the pt. And set it up

## 2013-11-13 ENCOUNTER — Telehealth: Payer: Self-pay

## 2013-11-13 NOTE — Telephone Encounter (Signed)
Need more information or orthotist to contact us.

## 2013-11-13 NOTE — Telephone Encounter (Signed)
Patient wife called requesting a new order for an AFO, no other details given.

## 2013-11-14 NOTE — Telephone Encounter (Signed)
Left message for patient to get more information regarding AFO sent to us.

## 2014-05-06 ENCOUNTER — Telehealth: Payer: Self-pay | Admitting: *Deleted

## 2014-05-06 NOTE — Telephone Encounter (Signed)
m[facility] or [facility] and insurance will require a visit to get new brace

## 2014-05-06 NOTE — Telephone Encounter (Signed)
Wife is calling on behalf of Zachary Mcbride (DOB  [birth year]). AFO's are worn out needs a new prescription for braces, does patient need an appt. With Dr. Riley KillSwartz or can they get the rx w/o appt.

## 2015-06-18 DIAGNOSIS — G8929 Other chronic pain: Secondary | ICD-10-CM | POA: Diagnosis not present

## 2015-06-18 DIAGNOSIS — R69 Illness, unspecified: Secondary | ICD-10-CM | POA: Diagnosis not present

## 2015-06-18 DIAGNOSIS — G610 Guillain-Barre syndrome: Secondary | ICD-10-CM | POA: Diagnosis not present

## 2015-06-18 DIAGNOSIS — G8220 Paraplegia, unspecified: Secondary | ICD-10-CM | POA: Diagnosis not present

## 2015-10-01 DIAGNOSIS — G8220 Paraplegia, unspecified: Secondary | ICD-10-CM | POA: Diagnosis not present

## 2015-10-01 DIAGNOSIS — R69 Illness, unspecified: Secondary | ICD-10-CM | POA: Diagnosis not present

## 2015-10-01 DIAGNOSIS — G8929 Other chronic pain: Secondary | ICD-10-CM | POA: Diagnosis not present

## 2015-10-01 DIAGNOSIS — G610 Guillain-Barre syndrome: Secondary | ICD-10-CM | POA: Diagnosis not present

## 2015-10-04 ENCOUNTER — Encounter: Payer: PRIVATE HEALTH INSURANCE | Admitting: Physical Medicine & Rehabilitation

## 2016-01-07 DIAGNOSIS — G610 Guillain-Barre syndrome: Secondary | ICD-10-CM | POA: Diagnosis not present

## 2016-01-07 DIAGNOSIS — R69 Illness, unspecified: Secondary | ICD-10-CM | POA: Diagnosis not present

## 2016-01-07 DIAGNOSIS — G8929 Other chronic pain: Secondary | ICD-10-CM | POA: Diagnosis not present

## 2016-01-07 DIAGNOSIS — G8220 Paraplegia, unspecified: Secondary | ICD-10-CM | POA: Diagnosis not present

## 2016-02-10 DIAGNOSIS — S62641A Nondisplaced fracture of proximal phalanx of left index finger, initial encounter for closed fracture: Secondary | ICD-10-CM | POA: Diagnosis not present

## 2016-02-10 DIAGNOSIS — S62640A Nondisplaced fracture of proximal phalanx of right index finger, initial encounter for closed fracture: Secondary | ICD-10-CM | POA: Diagnosis not present

## 2016-02-10 DIAGNOSIS — Z881 Allergy status to other antibiotic agents status: Secondary | ICD-10-CM | POA: Diagnosis not present

## 2016-02-10 DIAGNOSIS — R69 Illness, unspecified: Secondary | ICD-10-CM | POA: Diagnosis not present

## 2016-02-10 DIAGNOSIS — M25532 Pain in left wrist: Secondary | ICD-10-CM | POA: Diagnosis not present

## 2016-02-10 DIAGNOSIS — S60222A Contusion of left hand, initial encounter: Secondary | ICD-10-CM | POA: Diagnosis not present

## 2016-02-10 DIAGNOSIS — S6992XA Unspecified injury of left wrist, hand and finger(s), initial encounter: Secondary | ICD-10-CM | POA: Diagnosis not present

## 2016-02-10 DIAGNOSIS — W228XXA Striking against or struck by other objects, initial encounter: Secondary | ICD-10-CM | POA: Diagnosis not present

## 2016-05-02 DIAGNOSIS — G8929 Other chronic pain: Secondary | ICD-10-CM | POA: Diagnosis not present

## 2016-05-02 DIAGNOSIS — E782 Mixed hyperlipidemia: Secondary | ICD-10-CM | POA: Diagnosis not present

## 2016-05-02 DIAGNOSIS — G8220 Paraplegia, unspecified: Secondary | ICD-10-CM | POA: Diagnosis not present

## 2016-05-02 DIAGNOSIS — G610 Guillain-Barre syndrome: Secondary | ICD-10-CM | POA: Diagnosis not present

## 2016-05-02 DIAGNOSIS — I10 Essential (primary) hypertension: Secondary | ICD-10-CM | POA: Diagnosis not present

## 2016-05-02 DIAGNOSIS — R69 Illness, unspecified: Secondary | ICD-10-CM | POA: Diagnosis not present

## 2016-09-22 DIAGNOSIS — I10 Essential (primary) hypertension: Secondary | ICD-10-CM | POA: Diagnosis not present

## 2016-09-22 DIAGNOSIS — G8220 Paraplegia, unspecified: Secondary | ICD-10-CM | POA: Diagnosis not present

## 2016-09-22 DIAGNOSIS — E782 Mixed hyperlipidemia: Secondary | ICD-10-CM | POA: Diagnosis not present

## 2016-09-22 DIAGNOSIS — Z125 Encounter for screening for malignant neoplasm of prostate: Secondary | ICD-10-CM | POA: Diagnosis not present

## 2016-09-22 DIAGNOSIS — G8929 Other chronic pain: Secondary | ICD-10-CM | POA: Diagnosis not present

## 2016-09-22 DIAGNOSIS — F17201 Nicotine dependence, unspecified, in remission: Secondary | ICD-10-CM | POA: Diagnosis not present

## 2016-09-22 DIAGNOSIS — R69 Illness, unspecified: Secondary | ICD-10-CM | POA: Diagnosis not present

## 2016-09-22 DIAGNOSIS — Z0001 Encounter for general adult medical examination with abnormal findings: Secondary | ICD-10-CM | POA: Diagnosis not present

## 2016-09-22 DIAGNOSIS — Z1389 Encounter for screening for other disorder: Secondary | ICD-10-CM | POA: Diagnosis not present

## 2016-09-22 DIAGNOSIS — G610 Guillain-Barre syndrome: Secondary | ICD-10-CM | POA: Diagnosis not present

## 2016-10-19 DIAGNOSIS — L820 Inflamed seborrheic keratosis: Secondary | ICD-10-CM | POA: Diagnosis not present

## 2016-10-19 DIAGNOSIS — I10 Essential (primary) hypertension: Secondary | ICD-10-CM | POA: Diagnosis not present

## 2016-11-07 ENCOUNTER — Telehealth: Payer: Self-pay | Admitting: *Deleted

## 2016-11-07 NOTE — Telephone Encounter (Signed)
Zachary Mcbride's AFO is broken and his wife is asking for an new order for a brace.  He has not been seen since 10/21/2012.  Please advise.

## 2016-11-08 DIAGNOSIS — M21372 Foot drop, left foot: Secondary | ICD-10-CM | POA: Diagnosis not present

## 2016-11-08 DIAGNOSIS — M21371 Foot drop, right foot: Secondary | ICD-10-CM | POA: Diagnosis not present

## 2016-11-08 DIAGNOSIS — G610 Guillain-Barre syndrome: Secondary | ICD-10-CM | POA: Diagnosis not present

## 2016-11-08 NOTE — Telephone Encounter (Signed)
Notified. 

## 2016-11-08 NOTE — Telephone Encounter (Signed)
He will need to see me or his primary for an order for brace. It's been four years since I've seen him

## 2016-11-23 ENCOUNTER — Emergency Department (HOSPITAL_COMMUNITY): Payer: Medicare HMO

## 2016-11-23 ENCOUNTER — Encounter (HOSPITAL_COMMUNITY): Payer: Self-pay

## 2016-11-23 ENCOUNTER — Emergency Department (HOSPITAL_COMMUNITY)
Admission: EM | Admit: 2016-11-23 | Discharge: 2016-11-23 | Disposition: A | Discharge status: Home / Self Care | Payer: Medicare HMO | Attending: Emergency Medicine | Admitting: Emergency Medicine

## 2016-11-23 DIAGNOSIS — Z79899 Other long term (current) drug therapy: Secondary | ICD-10-CM | POA: Diagnosis not present

## 2016-11-23 DIAGNOSIS — R079 Chest pain, unspecified: Secondary | ICD-10-CM | POA: Diagnosis not present

## 2016-11-23 DIAGNOSIS — R911 Solitary pulmonary nodule: Secondary | ICD-10-CM | POA: Insufficient documentation

## 2016-11-23 DIAGNOSIS — R0789 Other chest pain: Secondary | ICD-10-CM | POA: Diagnosis not present

## 2016-11-23 DIAGNOSIS — Z87891 Personal history of nicotine dependence: Secondary | ICD-10-CM | POA: Diagnosis not present

## 2016-11-23 LAB — CBC
HEMATOCRIT: 44.1 % (ref 39.0–52.0)
HEMOGLOBIN: 15 g/dL (ref 13.0–17.0)
MCH: 28.8 pg (ref 26.0–34.0)
MCHC: 34 g/dL (ref 30.0–36.0)
MCV: 84.8 fL (ref 78.0–100.0)
Platelets: 180 10*3/uL (ref 150–400)
RBC: 5.2 MIL/uL (ref 4.22–5.81)
RDW: 13.6 % (ref 11.5–15.5)
WBC: 7.6 10*3/uL (ref 4.0–10.5)

## 2016-11-23 LAB — BASIC METABOLIC PANEL
ANION GAP: 9 (ref 5–15)
BUN: 10 mg/dL (ref 6–20)
CO2: 25 mmol/L (ref 22–32)
Calcium: 8.8 mg/dL — ABNORMAL LOW (ref 8.9–10.3)
Chloride: 104 mmol/L (ref 101–111)
Creatinine, Ser: 0.99 mg/dL (ref 0.61–1.24)
GFR calc Af Amer: >60 mL/min (ref >60)
GLUCOSE: 135 mg/dL — AB (ref 65–99)
Potassium: 3.7 mmol/L (ref 3.5–5.1)
Sodium: 138 mmol/L (ref 135–145)

## 2016-11-23 LAB — I-STAT TROPONIN, ED: Troponin i, poc: 0 ng/mL (ref 0.00–0.08)

## 2016-11-23 MED ORDER — KETOROLAC TROMETHAMINE 30 MG/ML IJ SOLN
30.0000 mg | Freq: Once | INTRAMUSCULAR | Status: DC
  Filled 2016-11-23: qty 1

## 2016-11-23 MED ORDER — KETOROLAC TROMETHAMINE 60 MG/2ML IM SOLN
60.0000 mg | Freq: Once | INTRAMUSCULAR | Status: AC
  Administered 2016-11-23: 60 mg via INTRAMUSCULAR

## 2016-11-23 NOTE — ED Triage Notes (Signed)
Pt presents to the ed with complaints of chest pain that is like a pressure in his chest into his  Left arm, states that this has been going on off and on for a month. Consistently for 2 days. Denies any shortness of breath.

## 2016-11-23 NOTE — Discharge Instructions (Signed)
Follow-up with her primary care doctor in the next 24-48 hours. Call their office to arrange for an appointment. Her primary care doctor will need to schedule for an outpatient CT to have further evaluation of the pulmonary nodule that we discussed.  Take ibuprofen as directed for pain.  Return the emergency Department for any worsening chest pain, difficult breathing, nausea/vomiting, sweating, fever or any other worsening or concerning symptoms.

## 2016-11-23 NOTE — ED Notes (Signed)
EDP at bedside  

## 2016-11-23 NOTE — ED Provider Notes (Signed)
[facility] Provider Note   CSN: [id number] Arrival date & time: 11/23/16  1131     History   Chief Complaint Chief Complaint  Patient presents with  . Chest Pain    HPI Zachary Mcbride is a 51 y.o. male. 2 days of constant left sided chest pain. Patient reports that he has had intermittent left sided chest pain for the past 6 months but states that it re-started 2 days ago and has been constant ever since. He denies any periods of pain free time during the last 2 days. He states that pain feels like a tightness/squeezing and states it is at a 7 out of 10. He reports that his current chest pain is similar to what he has experienced for the last 6 months. He denies any new changes in pain. He states that the chest pain is not associated with any exertion or positional movement. He states that the episodes are random and not brought about by any particular activity. He denies any episodes of diaphoresis or nausea during the chest pain episodes. He has not tried any medications. He also reports the last 2 days he has had intermittent left upper extremity tingling, but also notes that he has chronic sensory disturbance to left side due to previous guillain barre diagnosis. No episodes of weakness. He states that his prior to onset of symptoms he was working in the garden lot and was involved in a lot of raking and yard work but denies any heavy lifting. Patient is a smoker reports that he smokes 2-3 cigarettes a day. He denies any recent fever, illnesses, difficulty breathing, leg swelling, abdominal pain, nausea/vomiting, headache, dizziness. Patient denies any recent immobilization, recent surgeries, recent travel or any history of blood clots in legs or lungs.  The history is provided by the patient.    Past Medical History:  Diagnosis Date  . Anxiety   . Depression   . Numbness of extremity   . Tingling   . Trouble walking   . Weakness     Patient Active Problem List   Diagnosis Date Noted  . Guillain-Barre syndrome (HCC) 10/11/2012  . Nerve pain 10/11/2012    Past Surgical History:  Procedure Laterality Date  . GUILLAIN-BARRE SYNDROME        Home Medications    Prior to Admission medications   Medication Sig Start Date End Date Taking? Authorizing Provider  clonazePAM (KLONOPIN) 1 MG tablet Take 1 mg by mouth at bedtime as needed for anxiety.    [provider]  diltiazem (TIAZAC) 180 MG 24 hr capsule Take 180 mg by mouth daily.    [provider]  fentaNYL (DURAGESIC - DOSED MCG/HR) 75 MCG/HR Place 1 patch onto the skin every 3 (three) days.    [provider]  gabapentin (NEURONTIN) 800 MG tablet Take 800 mg by mouth 4 (four) times daily.    [provider]  HYDROcodone-acetaminophen (NORCO) 10-325 MG per tablet Take 1 tablet by mouth every 6 (six) hours as needed.    [provider]  nortriptyline (PAMELOR) 25 MG capsule Take 25 mg by mouth at bedtime. 1-2 PO QHS    [provider]  pantoprazole (PROTONIX) 20 MG tablet Take 20 mg by mouth as needed.    [provider]  simvastatin (ZOCOR) 20 MG tablet Take 20 mg by mouth every evening.    [provider]    Family History No family history on file.  Social History Social  History  Substance Use Topics  . Smoking status: Former Games developermoker  . Smokeless tobacco: Never Used  . Alcohol use Yes     Allergies   Vancomycin   Review of Systems Review of Systems  Constitutional: Negative for chills and fever.  HENT: Negative for congestion, rhinorrhea and sore throat.   Eyes: Negative for visual disturbance.  Respiratory: Negative for cough and shortness of breath.   Cardiovascular: Positive for chest pain.  Gastrointestinal: Negative for abdominal pain, diarrhea, nausea and vomiting.  Genitourinary: Negative for dysuria and hematuria.  Musculoskeletal: Negative for back pain and neck pain.  Skin: Negative for rash.    Neurological: Negative for dizziness, weakness, numbness and headaches.  Psychiatric/Behavioral: Negative for confusion.  All other systems reviewed and are negative.    Physical Exam Updated Vital Signs BP 106/67   Pulse 71   Temp 98.4 F (36.9 C) (Oral)   Resp 10   SpO2 96%   Physical Exam  Constitutional: He is oriented to person, place, and time. He appears well-developed and well-nourished.  Sitting comfortably on examination table  HENT:  Head: Normocephalic and atraumatic.  Mouth/Throat: Oropharynx is clear and moist and mucous membranes are normal.  Eyes: Conjunctivae, EOM and lids are normal. Pupils are equal, round, and reactive to light.  Neck: Full passive range of motion without pain.  Cardiovascular: Normal rate, regular rhythm, normal heart sounds and normal pulses.  Exam reveals no gallop and no friction rub.   No murmur heard. Pulmonary/Chest: Effort normal and breath sounds normal.  No evidence of respiratory distress. Able to speak in full sentences without difficulty.  Abdominal: Soft. Normal appearance. There is no tenderness. There is no rigidity and no guarding.  Musculoskeletal: Normal range of motion.  Neurological: He is alert and oriented to person, place, and time.  Cranial nerves III-XII intact Follows commands, Moves all extremities  5/5 strength to LUE, RUE and RLE, slightly diminished strength to LLE but is chronic per patient Diminished sensation reported to left side but patient states that is chronic secondary to previous guillain barre Normal finger to nose. No dysdiadochokinesia. No pronator drift. No slurred speech. Left sided facial droop (per patient and wife this is chronic and is secondary to previous guillian barre)   Skin: Skin is warm and dry. Capillary refill takes less than 2 seconds.  Psychiatric: He has a normal mood and affect. His speech is normal.  Nursing note and vitals reviewed.    ED Treatments / Results   Labs (all labs ordered are listed, but only abnormal results are displayed) Labs Reviewed  BASIC METABOLIC PANEL - Abnormal; Notable for the following:       Result Value   Glucose, Bld 135 (*)    Calcium 8.8 (*)    All other components within normal limits  CBC  I-STAT TROPOININ, ED    EKG  EKG Interpretation  Date/Time:  Thursday Nov 23 2016 13:06:05 EDT Ventricular Rate:  77 PR Interval:  168 QRS Duration: 94 QT Interval:  384 QTC Calculation: 434 R Axis:   -14 Text Interpretation:  Sinus rhythm with occasional Premature ventricular complexes Moderate voltage criteria for LVH, may be normal variant Borderline ECG Since previous tracing pvc is now present Confirmed by Jerelyn ScottLinker, Martha [phone]([id number]) on 11/24/2016 7:28:24 PM       Radiology No results found.  Procedures Procedures (including critical care time)  Medications Ordered in ED Medications  ketorolac (TORADOL) injection 60 mg (60 mg Intramuscular Given 11/23/16 1553)  Initial Impression / Assessment and Plan / ED Course  I have reviewed the triage vital signs and the nursing notes.  Pertinent labs & imaging results that were available during my care of the patient were reviewed by me and considered in my medical decision making (see chart for details).     51 y.o. M who presents with 2 days of constant left sided chest pain. He has previously experienced 6 months of intermittent left sided chest pain. No new changes in symptoms except that it has been constant for the past 48 hours. Consider ACS vs acute infectious etiology vs musculoskeletal strain, especially considering recent history of several hours of yard work which is abnormal for patient. History/physical exam are not concerning for a PE. Vitals reviewed. Patient's O2 sats have been >95% on RA and HR has been <100 throughout ED course. Labs and imaging, including Troponin, CBC, BMP, EKG, and CXR ordered at triage. Will give patient Toradol for pain relief.    Labs and imaging reviewed. Initial troponin is negative. EKG Reviewed. NRS, rate 74. No ST elevations. BMP within normal limits. CBC with no significant elevation in WBC, H&H stable. CXR negative for any acute infectious etiology. CXR does show a 4mm nodule in the left apex. Patient will likely need outpatient CT scan for follow-up.   Re-evaluation: Patient reports that he has had improvement in chest pain after Toradol use.  Discussed results with patient. We discussed that since his CP has been constant for 2 days and his troponin was negative, it is greater than the 6 hour window for troponin elevation. We discussed the option of admitting for further evaluation vs discharge home to follow-up with his primary care doctor on an outpatient office. At this time, patient wishes to be discharged home. Given that patient's heart score is a 3 and is he low risk for adverse cardiac event, and that he has had constant pain with no elevation in troponin, it is reasonable to discharge him at this time.   Patient instructed to follow-up with his primary care doctor regarding arranging for an outpatient CT scan for further evaluation of the nodule found on the CXR today. Strict return precautions discussed. Patient expresses understanding and agreement to plan.    Final Clinical Impressions(s) / ED Diagnoses   Final diagnoses:  Chest pain, unspecified type  Pulmonary nodule    New Prescriptions Discharge Medication List as of 11/23/2016  4:28 PM       Maxwell Caul, PA-C 11/25/16 Violeta Gelinas, MD 11/25/16 2022

## 2016-11-29 ENCOUNTER — Other Ambulatory Visit: Payer: Self-pay | Admitting: Internal Medicine

## 2016-11-29 DIAGNOSIS — R079 Chest pain, unspecified: Secondary | ICD-10-CM | POA: Diagnosis not present

## 2016-11-29 DIAGNOSIS — R911 Solitary pulmonary nodule: Secondary | ICD-10-CM

## 2016-11-29 DIAGNOSIS — R69 Illness, unspecified: Secondary | ICD-10-CM | POA: Diagnosis not present

## 2016-11-29 DIAGNOSIS — K0889 Other specified disorders of teeth and supporting structures: Secondary | ICD-10-CM | POA: Diagnosis not present

## 2016-12-07 ENCOUNTER — Ambulatory Visit
Admission: RE | Admit: 2016-12-07 | Discharge: 2016-12-07 | Disposition: A | Discharge status: Home / Self Care | Payer: Medicare HMO | Source: Ambulatory Visit | Attending: Internal Medicine | Admitting: Internal Medicine

## 2016-12-07 DIAGNOSIS — J9811 Atelectasis: Secondary | ICD-10-CM | POA: Diagnosis not present

## 2016-12-07 DIAGNOSIS — R911 Solitary pulmonary nodule: Secondary | ICD-10-CM

## 2016-12-11 DIAGNOSIS — M21372 Foot drop, left foot: Secondary | ICD-10-CM | POA: Diagnosis not present

## 2016-12-25 DIAGNOSIS — K0889 Other specified disorders of teeth and supporting structures: Secondary | ICD-10-CM | POA: Diagnosis not present

## 2016-12-25 DIAGNOSIS — G8929 Other chronic pain: Secondary | ICD-10-CM | POA: Diagnosis not present

## 2016-12-25 DIAGNOSIS — R911 Solitary pulmonary nodule: Secondary | ICD-10-CM | POA: Diagnosis not present

## 2016-12-25 DIAGNOSIS — R079 Chest pain, unspecified: Secondary | ICD-10-CM | POA: Diagnosis not present

## 2016-12-25 DIAGNOSIS — R69 Illness, unspecified: Secondary | ICD-10-CM | POA: Diagnosis not present

## 2017-03-28 DIAGNOSIS — G8929 Other chronic pain: Secondary | ICD-10-CM | POA: Diagnosis not present

## 2017-03-28 DIAGNOSIS — G4700 Insomnia, unspecified: Secondary | ICD-10-CM | POA: Diagnosis not present

## 2017-03-28 DIAGNOSIS — I10 Essential (primary) hypertension: Secondary | ICD-10-CM | POA: Diagnosis not present

## 2017-04-14 DIAGNOSIS — Z1211 Encounter for screening for malignant neoplasm of colon: Secondary | ICD-10-CM | POA: Diagnosis not present

## 2017-06-20 DIAGNOSIS — G8929 Other chronic pain: Secondary | ICD-10-CM | POA: Diagnosis not present

## 2017-06-20 DIAGNOSIS — E559 Vitamin D deficiency, unspecified: Secondary | ICD-10-CM | POA: Diagnosis not present

## 2017-06-20 DIAGNOSIS — K0889 Other specified disorders of teeth and supporting structures: Secondary | ICD-10-CM | POA: Diagnosis not present

## 2017-06-20 DIAGNOSIS — I10 Essential (primary) hypertension: Secondary | ICD-10-CM | POA: Diagnosis not present

## 2017-06-20 DIAGNOSIS — E782 Mixed hyperlipidemia: Secondary | ICD-10-CM | POA: Diagnosis not present

## 2017-07-27 DIAGNOSIS — K219 Gastro-esophageal reflux disease without esophagitis: Secondary | ICD-10-CM | POA: Diagnosis not present

## 2017-07-27 DIAGNOSIS — G629 Polyneuropathy, unspecified: Secondary | ICD-10-CM | POA: Diagnosis not present

## 2017-07-27 DIAGNOSIS — I739 Peripheral vascular disease, unspecified: Secondary | ICD-10-CM | POA: Diagnosis not present

## 2017-07-27 DIAGNOSIS — I2510 Atherosclerotic heart disease of native coronary artery without angina pectoris: Secondary | ICD-10-CM | POA: Diagnosis not present

## 2017-07-27 DIAGNOSIS — E785 Hyperlipidemia, unspecified: Secondary | ICD-10-CM | POA: Diagnosis not present

## 2017-07-27 DIAGNOSIS — E782 Mixed hyperlipidemia: Secondary | ICD-10-CM | POA: Diagnosis not present

## 2017-07-27 DIAGNOSIS — B0229 Other postherpetic nervous system involvement: Secondary | ICD-10-CM | POA: Diagnosis not present

## 2017-07-27 DIAGNOSIS — R69 Illness, unspecified: Secondary | ICD-10-CM | POA: Diagnosis not present

## 2017-07-27 DIAGNOSIS — K08109 Complete loss of teeth, unspecified cause, unspecified class: Secondary | ICD-10-CM | POA: Diagnosis not present

## 2017-07-27 DIAGNOSIS — G8929 Other chronic pain: Secondary | ICD-10-CM | POA: Diagnosis not present

## 2017-07-27 DIAGNOSIS — I10 Essential (primary) hypertension: Secondary | ICD-10-CM | POA: Diagnosis not present

## 2017-10-12 DIAGNOSIS — R69 Illness, unspecified: Secondary | ICD-10-CM | POA: Diagnosis not present

## 2017-10-12 DIAGNOSIS — G8929 Other chronic pain: Secondary | ICD-10-CM | POA: Diagnosis not present

## 2017-10-12 DIAGNOSIS — G479 Sleep disorder, unspecified: Secondary | ICD-10-CM | POA: Diagnosis not present

## 2017-11-14 DIAGNOSIS — I10 Essential (primary) hypertension: Secondary | ICD-10-CM | POA: Diagnosis not present

## 2017-11-14 DIAGNOSIS — E782 Mixed hyperlipidemia: Secondary | ICD-10-CM | POA: Diagnosis not present

## 2017-11-22 ENCOUNTER — Other Ambulatory Visit: Payer: Self-pay

## 2017-11-22 ENCOUNTER — Encounter (HOSPITAL_COMMUNITY): Payer: Self-pay

## 2017-11-22 ENCOUNTER — Emergency Department (HOSPITAL_COMMUNITY)
Admission: EM | Admit: 2017-11-22 | Discharge: 2017-11-22 | Disposition: A | Discharge status: Home / Self Care | Payer: Medicare HMO | Attending: Emergency Medicine | Admitting: Emergency Medicine

## 2017-11-22 DIAGNOSIS — S6992XA Unspecified injury of left wrist, hand and finger(s), initial encounter: Secondary | ICD-10-CM | POA: Diagnosis present

## 2017-11-22 DIAGNOSIS — Z79899 Other long term (current) drug therapy: Secondary | ICD-10-CM | POA: Insufficient documentation

## 2017-11-22 DIAGNOSIS — Y929 Unspecified place or not applicable: Secondary | ICD-10-CM | POA: Insufficient documentation

## 2017-11-22 DIAGNOSIS — Y999 Unspecified external cause status: Secondary | ICD-10-CM | POA: Insufficient documentation

## 2017-11-22 DIAGNOSIS — Y9389 Activity, other specified: Secondary | ICD-10-CM | POA: Insufficient documentation

## 2017-11-22 DIAGNOSIS — Z87891 Personal history of nicotine dependence: Secondary | ICD-10-CM | POA: Insufficient documentation

## 2017-11-22 DIAGNOSIS — W458XXA Other foreign body or object entering through skin, initial encounter: Secondary | ICD-10-CM | POA: Diagnosis not present

## 2017-11-22 DIAGNOSIS — S61012A Laceration without foreign body of left thumb without damage to nail, initial encounter: Secondary | ICD-10-CM | POA: Insufficient documentation

## 2017-11-22 MED ORDER — LIDOCAINE-EPINEPHRINE (PF) 2 %-1:200000 IJ SOLN
20.0000 mL | Freq: Once | INTRAMUSCULAR | Status: AC
  Administered 2017-11-22: 20 mL
  Filled 2017-11-22: qty 20

## 2017-11-22 MED ORDER — CEPHALEXIN 500 MG PO CAPS: 500.0000 mg | ORAL_CAPSULE | Freq: Two times a day (BID) | ORAL | 0 refills | Status: AC

## 2017-11-22 NOTE — ED Triage Notes (Signed)
Pt presents with laceration to left hand from a bumper while working on a car.

## 2017-11-22 NOTE — ED Provider Notes (Signed)
[facility] EMERGENCY DEPARTMENT Provider Note   CSN: [id number] Arrival date & time: 11/22/17  1123   History   Chief Complaint Chief Complaint  Patient presents with  . Laceration   HPI Zachary Mcbride is a 52 y.o. male.  HPI   52 year old male presents today with a laceration to his left hand.  Patient reports he was working on a car when her hand slipped and caused a laceration over the dorsal aspect of his left thumb.  Denies any loss of sensation strength and motor function.  Other than baseline secondary to Guillain-Barr.  Patient reports his tetanus was updated proximally 3 months ago.   Past Medical History:  Diagnosis Date  . Anxiety   . Depression   . Numbness of extremity   . Tingling   . Trouble walking   . Weakness     Patient Active Problem List   Diagnosis Date Noted  . Guillain-Barre syndrome (HCC) 10/11/2012  . Nerve pain 10/11/2012    Past Surgical History:  Procedure Laterality Date  . GUILLAIN-BARRE SYNDROME          Home Medications    Prior to Admission medications   Medication Sig Start Date End Date Taking? Authorizing Provider  cephALEXin (KEFLEX) 500 MG capsule Take 1 capsule (500 mg total) by mouth 2 (two) times daily. 11/22/17   Elaisha Zahniser, Tinnie Gens, PA-C  clonazePAM (KLONOPIN) 1 MG tablet Take 1 mg by mouth at bedtime as needed for anxiety.    [provider]  diltiazem (TIAZAC) 180 MG 24 hr capsule Take 180 mg by mouth daily.    [provider]  fentaNYL (DURAGESIC - DOSED MCG/HR) 75 MCG/HR Place 1 patch onto the skin every 3 (three) days.    [provider]  gabapentin (NEURONTIN) 800 MG tablet Take 800 mg by mouth 4 (four) times daily.    [provider]  HYDROcodone-acetaminophen (NORCO) 10-325 MG per tablet Take 1 tablet by mouth every 6 (six) hours as needed.    [provider]  nortriptyline (PAMELOR) 25 MG capsule Take 25 mg by mouth at bedtime. 1-2 PO QHS    [provider]  pantoprazole (PROTONIX) 20 MG tablet Take 20 mg by mouth as needed.    [provider]  simvastatin (ZOCOR) 20 MG tablet Take 20 mg by mouth every evening.    [provider]    Family History No family history on file.  Social History Social History   Tobacco Use  . Smoking status: Former Games developer  . Smokeless tobacco: Never Used  Substance Use Topics  . Alcohol use: Yes  . Drug use: Not on file     Allergies   Vancomycin   Review of Systems Review of Systems  All other systems reviewed and are negative.    Physical Exam Updated Vital Signs BP 110/76 (BP Location: Right Arm)   Pulse 75   Temp 98.3 F (36.8 C) (Oral)   Resp 16   SpO2 100%   Physical Exam  Constitutional: He is oriented to person, place, and time. He appears well-developed and well-nourished.  HENT:  Head: Normocephalic and atraumatic.  Eyes: Pupils are equal, round, and reactive to light. Conjunctivae are normal. Right eye exhibits no discharge. Left eye exhibits no discharge. No scleral icterus.  Neck: Normal range of motion. No JVD present. No tracheal deviation present.  Pulmonary/Chest: Effort normal. No stridor.  Musculoskeletal:  3 cm laceration over the dorsal left thumb, no  deep space involvement full active range of motion of the fingers with sensation intact  Neurological: He is alert and oriented to person, place, and time. Coordination normal.  Psychiatric: He has a normal mood and affect. His behavior is normal. Judgment and thought content normal.  Nursing note and vitals reviewed.    ED Treatments / Results  Labs (all labs ordered are listed, but only abnormal results are displayed) Labs Reviewed - No data to display  EKG None  Radiology No results found.  Procedures .Marland KitchenLaceration Repair Date/Time: 11/22/2017 5:47 PM Performed by: Eyvonne Mechanic, PA-C Authorized by: Eyvonne Mechanic, PA-C   Consent:    Consent obtained:  Verbal    Consent given by:  Patient   Risks discussed:  Infection   Alternatives discussed:  No treatment and delayed treatment Anesthesia (see MAR for exact dosages):    Anesthesia method:  Local infiltration   Local anesthetic:  Lidocaine 2% WITH epi Laceration details:    Location: Left hand.   Length (cm):  3 Repair type:    Repair type:  Simple Pre-procedure details:    Preparation:  Patient was prepped and draped in usual sterile fashion Exploration:    Wound exploration: wound explored through full range of motion and entire depth of wound probed and visualized     Wound extent: no fascia violation noted, no foreign bodies/material noted, no muscle damage noted, no nerve damage noted, no tendon damage noted, no underlying fracture noted and no vascular damage noted     Contaminated: no   Treatment:    Area cleansed with:  Betadine   Amount of cleaning:  Standard   Irrigation solution:  Sterile saline   Visualized foreign bodies/material removed: no   Skin repair:    Repair method:  Sutures   Number of sutures:  6 Approximation:    Approximation:  Close   (including critical care time)  Medications Ordered in ED Medications  lidocaine-EPINEPHrine (XYLOCAINE W/EPI) 2 %-1:200000 (PF) injection 20 mL (20 mLs Infiltration Given 11/22/17 1407)     Initial Impression / Assessment and Plan / ED Course  I have reviewed the triage vital signs and the nursing notes.  Pertinent labs & imaging results that were available during my care of the patient were reviewed by me and considered in my medical decision making (see chart for details).     Labs:   Imaging:  Consults:  Therapeutics: Again with epi  Discharge Meds:   Assessment/Plan: 52 year old male presents today with laceration.  This is uncomplicated, repaired here.  Patient will return with any signs of infection or complication.  Patient verbalized understanding and agreement to today's plan had no further questions or  concerns    Final Clinical Impressions(s) / ED Diagnoses   Final diagnoses:  Laceration of left thumb without foreign body without damage to nail, initial encounter    ED Discharge Orders        Ordered    cephALEXin (KEFLEX) 500 MG capsule  2 times daily     11/22/17 1350       Rosalio Loud 11/22/17 Adah Salvage, MD 11/23/17 [phone]

## 2017-11-22 NOTE — Discharge Instructions (Addendum)
Please read attached information. If you experience any new or worsening signs or symptoms please return to the emergency room for evaluation. Please follow-up with your primary care provider or specialist as discussed. Please use medication prescribed only as directed and discontinue taking if you have any concerning signs or symptoms.   °

## 2018-03-01 DIAGNOSIS — I10 Essential (primary) hypertension: Secondary | ICD-10-CM | POA: Diagnosis not present

## 2018-03-01 DIAGNOSIS — G8929 Other chronic pain: Secondary | ICD-10-CM | POA: Diagnosis not present

## 2018-04-08 DIAGNOSIS — E782 Mixed hyperlipidemia: Secondary | ICD-10-CM | POA: Diagnosis not present

## 2018-04-08 DIAGNOSIS — I10 Essential (primary) hypertension: Secondary | ICD-10-CM | POA: Diagnosis not present

## 2018-05-06 DIAGNOSIS — I10 Essential (primary) hypertension: Secondary | ICD-10-CM | POA: Diagnosis not present

## 2018-05-06 DIAGNOSIS — E782 Mixed hyperlipidemia: Secondary | ICD-10-CM | POA: Diagnosis not present

## 2018-06-27 DIAGNOSIS — E559 Vitamin D deficiency, unspecified: Secondary | ICD-10-CM | POA: Diagnosis not present

## 2018-06-27 DIAGNOSIS — Z720 Tobacco use: Secondary | ICD-10-CM | POA: Diagnosis not present

## 2018-06-27 DIAGNOSIS — K219 Gastro-esophageal reflux disease without esophagitis: Secondary | ICD-10-CM | POA: Diagnosis not present

## 2018-06-27 DIAGNOSIS — G8929 Other chronic pain: Secondary | ICD-10-CM | POA: Diagnosis not present

## 2018-06-27 DIAGNOSIS — G479 Sleep disorder, unspecified: Secondary | ICD-10-CM | POA: Diagnosis not present

## 2018-06-27 DIAGNOSIS — E782 Mixed hyperlipidemia: Secondary | ICD-10-CM | POA: Diagnosis not present

## 2018-06-27 DIAGNOSIS — G609 Hereditary and idiopathic neuropathy, unspecified: Secondary | ICD-10-CM | POA: Diagnosis not present

## 2018-06-27 DIAGNOSIS — I10 Essential (primary) hypertension: Secondary | ICD-10-CM | POA: Diagnosis not present

## 2018-09-11 DIAGNOSIS — E782 Mixed hyperlipidemia: Secondary | ICD-10-CM | POA: Diagnosis not present

## 2018-09-11 DIAGNOSIS — I10 Essential (primary) hypertension: Secondary | ICD-10-CM | POA: Diagnosis not present

## 2018-10-23 DIAGNOSIS — R69 Illness, unspecified: Secondary | ICD-10-CM | POA: Diagnosis not present

## 2018-10-23 DIAGNOSIS — G8929 Other chronic pain: Secondary | ICD-10-CM | POA: Diagnosis not present

## 2018-10-23 DIAGNOSIS — G609 Hereditary and idiopathic neuropathy, unspecified: Secondary | ICD-10-CM | POA: Diagnosis not present

## 2018-12-06 DIAGNOSIS — E782 Mixed hyperlipidemia: Secondary | ICD-10-CM | POA: Diagnosis not present

## 2018-12-06 DIAGNOSIS — I10 Essential (primary) hypertension: Secondary | ICD-10-CM | POA: Diagnosis not present

## 2019-01-07 DIAGNOSIS — Z720 Tobacco use: Secondary | ICD-10-CM | POA: Diagnosis not present

## 2019-01-07 DIAGNOSIS — G8929 Other chronic pain: Secondary | ICD-10-CM | POA: Diagnosis not present

## 2019-01-07 DIAGNOSIS — Z1211 Encounter for screening for malignant neoplasm of colon: Secondary | ICD-10-CM | POA: Diagnosis not present

## 2019-01-07 DIAGNOSIS — L84 Corns and callosities: Secondary | ICD-10-CM | POA: Diagnosis not present

## 2019-01-07 DIAGNOSIS — M21372 Foot drop, left foot: Secondary | ICD-10-CM | POA: Diagnosis not present

## 2019-01-07 DIAGNOSIS — I10 Essential (primary) hypertension: Secondary | ICD-10-CM | POA: Diagnosis not present

## 2019-01-07 DIAGNOSIS — M21371 Foot drop, right foot: Secondary | ICD-10-CM | POA: Diagnosis not present

## 2019-01-22 DIAGNOSIS — E782 Mixed hyperlipidemia: Secondary | ICD-10-CM | POA: Diagnosis not present

## 2019-01-22 DIAGNOSIS — I10 Essential (primary) hypertension: Secondary | ICD-10-CM | POA: Diagnosis not present

## 2019-01-28 DIAGNOSIS — M21371 Foot drop, right foot: Secondary | ICD-10-CM | POA: Diagnosis not present

## 2019-01-28 DIAGNOSIS — M21372 Foot drop, left foot: Secondary | ICD-10-CM | POA: Diagnosis not present

## 2019-02-20 DIAGNOSIS — Z1211 Encounter for screening for malignant neoplasm of colon: Secondary | ICD-10-CM | POA: Diagnosis not present

## 2019-04-22 DIAGNOSIS — K219 Gastro-esophageal reflux disease without esophagitis: Secondary | ICD-10-CM | POA: Diagnosis not present

## 2019-04-22 DIAGNOSIS — G894 Chronic pain syndrome: Secondary | ICD-10-CM | POA: Diagnosis not present

## 2019-04-22 DIAGNOSIS — G609 Hereditary and idiopathic neuropathy, unspecified: Secondary | ICD-10-CM | POA: Diagnosis not present

## 2019-04-22 DIAGNOSIS — I10 Essential (primary) hypertension: Secondary | ICD-10-CM | POA: Diagnosis not present

## 2019-04-22 DIAGNOSIS — R000 Tachycardia, unspecified: Secondary | ICD-10-CM | POA: Diagnosis not present

## 2019-04-22 DIAGNOSIS — R69 Illness, unspecified: Secondary | ICD-10-CM | POA: Diagnosis not present

## 2019-04-22 DIAGNOSIS — E559 Vitamin D deficiency, unspecified: Secondary | ICD-10-CM | POA: Diagnosis not present

## 2019-04-22 DIAGNOSIS — G8220 Paraplegia, unspecified: Secondary | ICD-10-CM | POA: Diagnosis not present

## 2019-04-22 DIAGNOSIS — K047 Periapical abscess without sinus: Secondary | ICD-10-CM | POA: Diagnosis not present

## 2019-05-26 DIAGNOSIS — I10 Essential (primary) hypertension: Secondary | ICD-10-CM | POA: Diagnosis not present

## 2019-05-26 DIAGNOSIS — E782 Mixed hyperlipidemia: Secondary | ICD-10-CM | POA: Diagnosis not present

## 2019-06-14 DIAGNOSIS — K219 Gastro-esophageal reflux disease without esophagitis: Secondary | ICD-10-CM | POA: Diagnosis not present

## 2019-06-14 DIAGNOSIS — R69 Illness, unspecified: Secondary | ICD-10-CM | POA: Diagnosis not present

## 2019-06-14 DIAGNOSIS — K047 Periapical abscess without sinus: Secondary | ICD-10-CM | POA: Diagnosis not present

## 2019-06-14 DIAGNOSIS — I739 Peripheral vascular disease, unspecified: Secondary | ICD-10-CM | POA: Diagnosis not present

## 2019-06-14 DIAGNOSIS — E785 Hyperlipidemia, unspecified: Secondary | ICD-10-CM | POA: Diagnosis not present

## 2019-06-14 DIAGNOSIS — G8929 Other chronic pain: Secondary | ICD-10-CM | POA: Diagnosis not present

## 2019-06-14 DIAGNOSIS — I10 Essential (primary) hypertension: Secondary | ICD-10-CM | POA: Diagnosis not present

## 2019-06-14 DIAGNOSIS — Z008 Encounter for other general examination: Secondary | ICD-10-CM | POA: Diagnosis not present

## 2019-06-14 DIAGNOSIS — G4700 Insomnia, unspecified: Secondary | ICD-10-CM | POA: Diagnosis not present

## 2019-06-14 DIAGNOSIS — G629 Polyneuropathy, unspecified: Secondary | ICD-10-CM | POA: Diagnosis not present

## 2019-07-08 DIAGNOSIS — I10 Essential (primary) hypertension: Secondary | ICD-10-CM | POA: Diagnosis not present

## 2019-07-08 DIAGNOSIS — E782 Mixed hyperlipidemia: Secondary | ICD-10-CM | POA: Diagnosis not present

## 2019-07-21 DIAGNOSIS — G8929 Other chronic pain: Secondary | ICD-10-CM | POA: Diagnosis not present

## 2019-08-21 DIAGNOSIS — I10 Essential (primary) hypertension: Secondary | ICD-10-CM | POA: Diagnosis not present

## 2019-08-21 DIAGNOSIS — E782 Mixed hyperlipidemia: Secondary | ICD-10-CM | POA: Diagnosis not present

## 2019-09-22 DIAGNOSIS — E782 Mixed hyperlipidemia: Secondary | ICD-10-CM | POA: Diagnosis not present

## 2019-09-22 DIAGNOSIS — G4700 Insomnia, unspecified: Secondary | ICD-10-CM | POA: Diagnosis not present

## 2019-09-22 DIAGNOSIS — I10 Essential (primary) hypertension: Secondary | ICD-10-CM | POA: Diagnosis not present

## 2019-10-20 DIAGNOSIS — G8929 Other chronic pain: Secondary | ICD-10-CM | POA: Diagnosis not present

## 2019-10-20 DIAGNOSIS — L1230 Acquired epidermolysis bullosa, unspecified: Secondary | ICD-10-CM | POA: Diagnosis not present

## 2019-10-20 DIAGNOSIS — I10 Essential (primary) hypertension: Secondary | ICD-10-CM | POA: Diagnosis not present

## 2019-10-20 DIAGNOSIS — E782 Mixed hyperlipidemia: Secondary | ICD-10-CM | POA: Diagnosis not present

## 2019-10-20 DIAGNOSIS — G4700 Insomnia, unspecified: Secondary | ICD-10-CM | POA: Diagnosis not present

## 2019-10-29 DIAGNOSIS — E782 Mixed hyperlipidemia: Secondary | ICD-10-CM | POA: Diagnosis not present

## 2019-10-29 DIAGNOSIS — I10 Essential (primary) hypertension: Secondary | ICD-10-CM | POA: Diagnosis not present

## 2019-10-29 DIAGNOSIS — G4700 Insomnia, unspecified: Secondary | ICD-10-CM | POA: Diagnosis not present

## 2019-11-20 DIAGNOSIS — G4700 Insomnia, unspecified: Secondary | ICD-10-CM | POA: Diagnosis not present

## 2019-11-20 DIAGNOSIS — I10 Essential (primary) hypertension: Secondary | ICD-10-CM | POA: Diagnosis not present

## 2019-11-20 DIAGNOSIS — E782 Mixed hyperlipidemia: Secondary | ICD-10-CM | POA: Diagnosis not present

## 2019-12-22 DIAGNOSIS — I10 Essential (primary) hypertension: Secondary | ICD-10-CM | POA: Diagnosis not present

## 2020-01-03 ENCOUNTER — Other Ambulatory Visit: Payer: Self-pay

## 2020-01-03 ENCOUNTER — Ambulatory Visit (HOSPITAL_COMMUNITY)
Admission: EM | Admit: 2020-01-03 | Discharge: 2020-01-03 | Disposition: A | Discharge status: Home / Self Care | Payer: Medicare HMO | Attending: Family Medicine | Admitting: Family Medicine

## 2020-01-03 ENCOUNTER — Encounter (HOSPITAL_COMMUNITY): Payer: Self-pay

## 2020-01-03 DIAGNOSIS — M79645 Pain in left finger(s): Secondary | ICD-10-CM

## 2020-01-03 DIAGNOSIS — L03012 Cellulitis of left finger: Secondary | ICD-10-CM

## 2020-01-03 MED ORDER — PENTAFLUOROPROP-TETRAFLUOROETH EX AERO
INHALATION_SPRAY | CUTANEOUS | Status: AC
Start: 2020-01-03 — End: ?
  Filled 2020-01-03: qty 116

## 2020-01-03 MED ORDER — AMOXICILLIN-POT CLAVULANATE 875-125 MG PO TABS: 1.0000 | ORAL_TABLET | Freq: Two times a day (BID) | ORAL | 0 refills | Status: AC

## 2020-01-03 NOTE — ED Provider Notes (Signed)
[facility]   [id number] 01/03/20 Arrival Time: 1254  CC: SKIN  SUBJECTIVE:  Zachary Mcbride is a 54 y.o. male who presents with a skin complaint that began about 3 days ago. Reports that he has been having pain and swelling around the nail of his left thumb, mostly on the medial aspect. Reports that he has had something like this before on his other finger, and had to be incised and drained. Denies precipitating event or trauma.  Describes it as painful. Has not taken OTC medications for this. Symptoms are made worse with outpatient. Denies fever, chills, nausea, vomiting, discharge, oral lesions, SOB, chest pain, abdominal pain, changes in bowel or bladder function.    ROS: As per HPI.  All other pertinent ROS negative.     Past Medical History:  Diagnosis Date  . Anxiety   . Depression   . Numbness of extremity   . Tingling   . Trouble walking   . Weakness    Past Surgical History:  Procedure Laterality Date  . GUILLAIN-BARRE SYNDROME     Allergies  Allergen Reactions  . Vancomycin Swelling and Rash   No current facility-administered medications on file prior to encounter.   Current Outpatient Medications on File Prior to Encounter  Medication Sig Dispense Refill  . clonazePAM (KLONOPIN) 1 MG tablet Take 1 mg by mouth at bedtime as needed for anxiety.    Marland Kitchen diltiazem (TIAZAC) 180 MG 24 hr capsule Take 180 mg by mouth daily.    . fentaNYL (DURAGESIC - DOSED MCG/HR) 75 MCG/HR Place 1 patch onto the skin every 3 (three) days.    Marland Kitchen gabapentin (NEURONTIN) 800 MG tablet Take 800 mg by mouth 4 (four) times daily.    Marland Kitchen HYDROcodone-acetaminophen (NORCO) 10-325 MG per tablet Take 1 tablet by mouth every 6 (six) hours as needed.    . nortriptyline (PAMELOR) 25 MG capsule Take 25 mg by mouth at bedtime. 1-2 PO QHS    . pantoprazole (PROTONIX) 20 MG tablet Take 20 mg by mouth as needed.    . simvastatin (ZOCOR) 20 MG tablet Take 20 mg by mouth every evening.    . cephALEXin  (KEFLEX) 500 MG capsule Take 1 capsule (500 mg total) by mouth 2 (two) times daily. 6 capsule 0   Social History   Socioeconomic History  . Marital status: Married    Spouse name: Not on file  . Number of children: Not on file  . Years of education: Not on file  . Highest education level: Not on file  Occupational History  . Not on file  Tobacco Use  . Smoking status: Former Games developer  . Smokeless tobacco: Never Used  Substance and Sexual Activity  . Alcohol use: Yes  . Drug use: Not on file  . Sexual activity: Not on file  Other Topics Concern  . Not on file  Social History Narrative  . Not on file   Social Determinants of Health   Financial Resource Strain:   . Difficulty of Paying Living Expenses:   Food Insecurity:   . Worried About Programme researcher, broadcasting/film/video in the Last Year:   . Barista in the Last Year:   Transportation Needs:   . Freight forwarder (Medical):   Marland Kitchen Lack of Transportation (Non-Medical):   Physical Activity:   . Days of Exercise per Week:   . Minutes of Exercise per Session:   Stress:   . Feeling of Stress :  Social Connections:   . Frequency of Communication with Friends and Family:   . Frequency of Social Gatherings with Friends and Family:   . Attends Religious Services:   . Active Member of Clubs or Organizations:   . Attends Archivist Meetings:   Marland Kitchen Marital Status:   Intimate Partner Violence:   . Fear of Current or Ex-Partner:   . Emotionally Abused:   Marland Kitchen Physically Abused:   . Sexually Abused:    History reviewed. No pertinent family history.  OBJECTIVE: Vitals:   01/03/20 1306  BP: 115/70  Pulse: 98  Resp: 18  Temp: 98.8 F (37.1 C)  TempSrc: Oral    General appearance: alert; no distress Head: NCAT Lungs: clear to auscultation bilaterally Heart: regular rate and rhythm.  Radial pulse 2+ bilaterally Extremities: no edema Skin: warm and dry; erythema, swelling, obvious pus pocket to the medial aspect of the  left thumb Psychological: alert and cooperative; normal mood and affect  ASSESSMENT & PLAN:  1. Thumb pain, left   2. Paronychia of left thumb     Meds ordered this encounter  Medications  . amoxicillin-clavulanate (AUGMENTIN) 875-125 MG tablet    Sig: Take 1 tablet by mouth 2 (two) times daily for 10 days.    Dispense:  20 tablet    Refill:  0    Order Specific Question:   Supervising Provider    Answer:   Chase Picket [id number]     Incised with 18-gauge needle, purulent drainage expressed Instructed to apply warm compresses and continue to drain at home Take all of the antibiotic as prescribed and to completion. Avoid hot showers/ baths Moisturize skin daily  Follow up with PCP if symptoms persists Return or go to the ER if you have any new or worsening symptoms such as fever, chills, nausea, vomiting, redness, swelling, discharge, if symptoms do not improve with medications  Reviewed expectations re: course of current medical issues. Questions answered. Outlined signs and symptoms indicating need for more acute intervention. Patient verbalized understanding. After Visit Summary given.   Faustino Congress, NP 01/04/20 1214

## 2020-01-03 NOTE — ED Triage Notes (Signed)
Pt presents today for left thumb swelling x3 days. Pt denies injury or trauma to thumb. Pt denies bites, or irritants. Pt states previous occurred to left index finger requiring IND. Pt has been taking left over amoxicillin with out relief. Pt denies OTC pain meds or relieving factors. Upon assessment left thumb found to be red and swollen.

## 2020-01-03 NOTE — Discharge Instructions (Signed)
Have sent Augmentin over to your pharmacy Take this twice a day for 7 days  We have incised your finger, you may apply warm compresses at home  If you are not improving over the next day or two, follow-up with this office or primary care as needed

## 2020-01-07 DIAGNOSIS — E782 Mixed hyperlipidemia: Secondary | ICD-10-CM | POA: Diagnosis not present

## 2020-01-07 DIAGNOSIS — I10 Essential (primary) hypertension: Secondary | ICD-10-CM | POA: Diagnosis not present

## 2020-01-07 DIAGNOSIS — G4700 Insomnia, unspecified: Secondary | ICD-10-CM | POA: Diagnosis not present

## 2020-01-08 DIAGNOSIS — M21371 Foot drop, right foot: Secondary | ICD-10-CM | POA: Diagnosis not present

## 2020-01-08 DIAGNOSIS — M21372 Foot drop, left foot: Secondary | ICD-10-CM | POA: Diagnosis not present

## 2020-01-08 DIAGNOSIS — G8220 Paraplegia, unspecified: Secondary | ICD-10-CM | POA: Diagnosis not present

## 2020-01-08 DIAGNOSIS — G8929 Other chronic pain: Secondary | ICD-10-CM | POA: Diagnosis not present

## 2020-01-08 DIAGNOSIS — I10 Essential (primary) hypertension: Secondary | ICD-10-CM | POA: Diagnosis not present

## 2020-03-30 DIAGNOSIS — G8929 Other chronic pain: Secondary | ICD-10-CM | POA: Diagnosis not present

## 2020-03-30 DIAGNOSIS — I10 Essential (primary) hypertension: Secondary | ICD-10-CM | POA: Diagnosis not present

## 2020-03-30 DIAGNOSIS — G4700 Insomnia, unspecified: Secondary | ICD-10-CM | POA: Diagnosis not present

## 2020-03-30 DIAGNOSIS — E782 Mixed hyperlipidemia: Secondary | ICD-10-CM | POA: Diagnosis not present

## 2020-04-13 DIAGNOSIS — I10 Essential (primary) hypertension: Secondary | ICD-10-CM | POA: Diagnosis not present

## 2020-04-13 DIAGNOSIS — G8220 Paraplegia, unspecified: Secondary | ICD-10-CM | POA: Diagnosis not present

## 2020-04-13 DIAGNOSIS — G8929 Other chronic pain: Secondary | ICD-10-CM | POA: Diagnosis not present

## 2020-04-13 DIAGNOSIS — M21372 Foot drop, left foot: Secondary | ICD-10-CM | POA: Diagnosis not present

## 2020-04-13 DIAGNOSIS — M21371 Foot drop, right foot: Secondary | ICD-10-CM | POA: Diagnosis not present

## 2020-05-19 DIAGNOSIS — E782 Mixed hyperlipidemia: Secondary | ICD-10-CM | POA: Diagnosis not present

## 2020-05-19 DIAGNOSIS — G8929 Other chronic pain: Secondary | ICD-10-CM | POA: Diagnosis not present

## 2020-05-19 DIAGNOSIS — K219 Gastro-esophageal reflux disease without esophagitis: Secondary | ICD-10-CM | POA: Diagnosis not present

## 2020-05-19 DIAGNOSIS — I10 Essential (primary) hypertension: Secondary | ICD-10-CM | POA: Diagnosis not present

## 2020-05-19 DIAGNOSIS — G4700 Insomnia, unspecified: Secondary | ICD-10-CM | POA: Diagnosis not present

## 2020-06-09 DIAGNOSIS — G610 Guillain-Barre syndrome: Secondary | ICD-10-CM | POA: Diagnosis not present

## 2020-06-09 DIAGNOSIS — M21371 Foot drop, right foot: Secondary | ICD-10-CM | POA: Diagnosis not present

## 2020-06-09 DIAGNOSIS — M21372 Foot drop, left foot: Secondary | ICD-10-CM | POA: Diagnosis not present

## 2020-07-27 DIAGNOSIS — G4700 Insomnia, unspecified: Secondary | ICD-10-CM | POA: Diagnosis not present

## 2020-07-27 DIAGNOSIS — E782 Mixed hyperlipidemia: Secondary | ICD-10-CM | POA: Diagnosis not present

## 2020-07-27 DIAGNOSIS — G8929 Other chronic pain: Secondary | ICD-10-CM | POA: Diagnosis not present

## 2020-07-27 DIAGNOSIS — I10 Essential (primary) hypertension: Secondary | ICD-10-CM | POA: Diagnosis not present

## 2020-07-27 DIAGNOSIS — K219 Gastro-esophageal reflux disease without esophagitis: Secondary | ICD-10-CM | POA: Diagnosis not present

## 2020-07-30 DIAGNOSIS — Z720 Tobacco use: Secondary | ICD-10-CM | POA: Diagnosis not present

## 2020-07-30 DIAGNOSIS — M21372 Foot drop, left foot: Secondary | ICD-10-CM | POA: Diagnosis not present

## 2020-07-30 DIAGNOSIS — G8929 Other chronic pain: Secondary | ICD-10-CM | POA: Diagnosis not present

## 2020-07-30 DIAGNOSIS — K219 Gastro-esophageal reflux disease without esophagitis: Secondary | ICD-10-CM | POA: Diagnosis not present

## 2020-07-30 DIAGNOSIS — G4700 Insomnia, unspecified: Secondary | ICD-10-CM | POA: Diagnosis not present

## 2020-07-30 DIAGNOSIS — G609 Hereditary and idiopathic neuropathy, unspecified: Secondary | ICD-10-CM | POA: Diagnosis not present

## 2020-07-30 DIAGNOSIS — I10 Essential (primary) hypertension: Secondary | ICD-10-CM | POA: Diagnosis not present

## 2020-07-30 DIAGNOSIS — L84 Corns and callosities: Secondary | ICD-10-CM | POA: Diagnosis not present

## 2020-07-30 DIAGNOSIS — E559 Vitamin D deficiency, unspecified: Secondary | ICD-10-CM | POA: Diagnosis not present

## 2020-07-30 DIAGNOSIS — E782 Mixed hyperlipidemia: Secondary | ICD-10-CM | POA: Diagnosis not present

## 2020-07-30 DIAGNOSIS — Z125 Encounter for screening for malignant neoplasm of prostate: Secondary | ICD-10-CM | POA: Diagnosis not present

## 2020-07-30 DIAGNOSIS — Z0001 Encounter for general adult medical examination with abnormal findings: Secondary | ICD-10-CM | POA: Diagnosis not present

## 2020-09-20 DIAGNOSIS — G4700 Insomnia, unspecified: Secondary | ICD-10-CM | POA: Diagnosis not present

## 2020-09-20 DIAGNOSIS — G8929 Other chronic pain: Secondary | ICD-10-CM | POA: Diagnosis not present

## 2020-09-20 DIAGNOSIS — K219 Gastro-esophageal reflux disease without esophagitis: Secondary | ICD-10-CM | POA: Diagnosis not present

## 2020-09-20 DIAGNOSIS — E782 Mixed hyperlipidemia: Secondary | ICD-10-CM | POA: Diagnosis not present

## 2020-09-20 DIAGNOSIS — I10 Essential (primary) hypertension: Secondary | ICD-10-CM | POA: Diagnosis not present

## 2020-10-13 ENCOUNTER — Ambulatory Visit (INDEPENDENT_AMBULATORY_CARE_PROVIDER_SITE_OTHER): Payer: Medicare HMO | Admitting: Podiatry

## 2020-10-13 ENCOUNTER — Other Ambulatory Visit: Payer: Self-pay

## 2020-10-13 DIAGNOSIS — B070 Plantar wart: Secondary | ICD-10-CM

## 2020-10-13 DIAGNOSIS — G610 Guillain-Barre syndrome: Secondary | ICD-10-CM

## 2020-10-13 DIAGNOSIS — M21371 Foot drop, right foot: Secondary | ICD-10-CM | POA: Diagnosis not present

## 2020-10-13 DIAGNOSIS — M21372 Foot drop, left foot: Secondary | ICD-10-CM | POA: Diagnosis not present

## 2020-10-13 NOTE — Progress Notes (Signed)
   Subjective: 55 y.o. male presenting today as a new patient for evaluation of symptomatic skin lesions to the subfifth MTPJ and heel of the right foot has been present for several months.  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  He most recently got the AFO bracing from [facility].  He presents for further treatment and evaluation   Past Medical History:  Diagnosis Date  . Anxiety   . Depression   . Numbness of extremity   . Tingling   . Trouble walking   . Weakness     Objective: Physical Exam General: The patient is alert and oriented x3 in no acute distress.   Dermatology: Hyperkeratotic skin lesion(s) noted to the plantar aspect of the right foot approximately 1 cm in diameter to the subfifth MTPJ and plantar heel. Pinpoint bleeding noted upon debridement. Skin is warm, dry and supple bilateral lower extremities. Negative for open lesions or macerations.   Vascular: Palpable pedal pulses bilaterally. No edema or erythema noted. Capillary refill within normal limits.   Neurological: Epicritic and protective threshold grossly intact bilaterally.    Musculoskeletal Exam: Pain on palpation to the noted skin lesion(s).  Range of motion within normal limits to all pedal and ankle joints bilateral. Muscle strength 5/5 in all groups bilateral.    Assessment: #1 plantar wart right foot #2 pain in right foot     Plan of Care:  #1 Patient was evaluated. #2 Excisional debridement of the plantar wart lesion(s) was performed using a chisel blade.  Salicylic acid was applied and the lesion(s) was dressed with a dry sterile dressing. #3  Recommend OTC salicylic acid daily  #4 offloading dancers felt pads were provided for the patient to offload the symptomatic lesions  #5 patient is to return to clinic as needed     Felecia Shelling, DPM [facility]  Dr. Felecia Shelling, DPM    [address] [place], [place] [address]                Office [phone]  Fax [phone]

## 2020-11-15 DIAGNOSIS — G8929 Other chronic pain: Secondary | ICD-10-CM | POA: Diagnosis not present

## 2020-11-15 DIAGNOSIS — M21372 Foot drop, left foot: Secondary | ICD-10-CM | POA: Diagnosis not present

## 2020-11-15 DIAGNOSIS — E782 Mixed hyperlipidemia: Secondary | ICD-10-CM | POA: Diagnosis not present

## 2020-11-15 DIAGNOSIS — G4700 Insomnia, unspecified: Secondary | ICD-10-CM | POA: Diagnosis not present

## 2020-11-15 DIAGNOSIS — M21371 Foot drop, right foot: Secondary | ICD-10-CM | POA: Diagnosis not present

## 2020-11-15 DIAGNOSIS — I10 Essential (primary) hypertension: Secondary | ICD-10-CM | POA: Diagnosis not present

## 2020-11-15 DIAGNOSIS — E559 Vitamin D deficiency, unspecified: Secondary | ICD-10-CM | POA: Diagnosis not present

## 2020-11-15 DIAGNOSIS — Z720 Tobacco use: Secondary | ICD-10-CM | POA: Diagnosis not present

## 2020-11-15 DIAGNOSIS — G609 Hereditary and idiopathic neuropathy, unspecified: Secondary | ICD-10-CM | POA: Diagnosis not present

## 2020-12-20 DIAGNOSIS — G8929 Other chronic pain: Secondary | ICD-10-CM | POA: Diagnosis not present

## 2020-12-20 DIAGNOSIS — E782 Mixed hyperlipidemia: Secondary | ICD-10-CM | POA: Diagnosis not present

## 2020-12-20 DIAGNOSIS — G4700 Insomnia, unspecified: Secondary | ICD-10-CM | POA: Diagnosis not present

## 2020-12-20 DIAGNOSIS — K219 Gastro-esophageal reflux disease without esophagitis: Secondary | ICD-10-CM | POA: Diagnosis not present

## 2020-12-20 DIAGNOSIS — I10 Essential (primary) hypertension: Secondary | ICD-10-CM | POA: Diagnosis not present

## 2021-01-28 DIAGNOSIS — S5001XA Contusion of right elbow, initial encounter: Secondary | ICD-10-CM | POA: Diagnosis not present

## 2021-01-28 DIAGNOSIS — I10 Essential (primary) hypertension: Secondary | ICD-10-CM | POA: Diagnosis not present

## 2021-02-02 ENCOUNTER — Other Ambulatory Visit: Payer: Self-pay

## 2021-02-02 ENCOUNTER — Emergency Department (HOSPITAL_BASED_OUTPATIENT_CLINIC_OR_DEPARTMENT_OTHER): Payer: Worker's Compensation

## 2021-02-02 ENCOUNTER — Emergency Department (HOSPITAL_BASED_OUTPATIENT_CLINIC_OR_DEPARTMENT_OTHER)
Admission: EM | Admit: 2021-02-02 | Discharge: 2021-02-02 | Disposition: A | Discharge status: Home / Self Care | Payer: Worker's Compensation | Attending: Emergency Medicine | Admitting: Emergency Medicine

## 2021-02-02 ENCOUNTER — Encounter (HOSPITAL_BASED_OUTPATIENT_CLINIC_OR_DEPARTMENT_OTHER): Payer: Self-pay | Admitting: Emergency Medicine

## 2021-02-02 DIAGNOSIS — W231XXA Caught, crushed, jammed, or pinched between stationary objects, initial encounter: Secondary | ICD-10-CM | POA: Diagnosis not present

## 2021-02-02 DIAGNOSIS — S6721XA Crushing injury of right hand, initial encounter: Secondary | ICD-10-CM

## 2021-02-02 DIAGNOSIS — Y9289 Other specified places as the place of occurrence of the external cause: Secondary | ICD-10-CM | POA: Insufficient documentation

## 2021-02-02 DIAGNOSIS — S60222A Contusion of left hand, initial encounter: Secondary | ICD-10-CM | POA: Diagnosis not present

## 2021-02-02 DIAGNOSIS — S6722XA Crushing injury of left hand, initial encounter: Secondary | ICD-10-CM | POA: Diagnosis present

## 2021-02-02 DIAGNOSIS — Y990 Civilian activity done for income or pay: Secondary | ICD-10-CM | POA: Diagnosis not present

## 2021-02-02 DIAGNOSIS — Z87891 Personal history of nicotine dependence: Secondary | ICD-10-CM | POA: Diagnosis not present

## 2021-02-02 MED ORDER — HYDROCODONE-ACETAMINOPHEN 5-325 MG PO TABS
2.0000 | ORAL_TABLET | Freq: Once | ORAL | Status: AC
  Administered 2021-02-02: 2 via ORAL
  Filled 2021-02-02: qty 2

## 2021-02-02 MED ORDER — HYDROCODONE-ACETAMINOPHEN 5-325 MG PO TABS: 1.0000 | ORAL_TABLET | Freq: Four times a day (QID) | ORAL | 0 refills | Status: DC | PRN

## 2021-02-02 NOTE — ED Notes (Signed)
See EDP assessment 

## 2021-02-02 NOTE — ED Notes (Signed)
Patient angry bc it is taking too long for xray to be read.  Called Radiology and they are behind.  Advised patient he couldn't stand out in the hall and patient got angry and stated "you mean to tell me I can't stand right here"  I stated :"yes sir due to covid and patient privacy".  Patient got angry because another patient was in the hall ambulating which she was asked to do so and stated "so she can walk around and grunt and make ruckus but I can't stand here".  Again told patient he needs to step into his room.

## 2021-02-02 NOTE — Discharge Instructions (Addendum)
Keep your hand elevated as much as possible for the next 3 days.  Ice for 20 minutes every 2 hours while awake for the next 2 days.  Take hydrocodone as prescribed as needed for pain.  Return to the emergency department if you develop numbness of the fingertips, discoloration of the fingertips, worsening pain, worsening tightness of the soft tissues of the hand, or for other new and concerning symptoms.

## 2021-02-02 NOTE — ED Provider Notes (Signed)
[facility] EMERGENCY DEPARTMENT Provider Note   CSN: [id number] Arrival date & time: 02/02/21  0304     History Chief Complaint  Patient presents with   Hand Injury    Zachary Mcbride is a 55 y.o. male.  Patient is a 55 year old male with past medical history of Guillain-Barr.  Patient presenting with complaints of left hand injury.  He states he was at work this evening when his left hand got pulled into a press roller.  He has a crush injury to the left hand.  He describes swelling and discomfort.  The history is provided by the patient.  Hand Injury Location:  Hand Hand location:  L hand Injury: yes   Mechanism of injury: crush   Pain details:    Quality:  Aching   Radiates to:  Does not radiate   Severity:  Moderate   Onset quality:  Sudden   Timing:  Constant   Progression:  Worsening     Past Medical History:  Diagnosis Date   Anxiety    Depression    Numbness of extremity    Tingling    Trouble walking    Weakness     Patient Active Problem List   Diagnosis Date Noted   Guillain-Barre syndrome (HCC) 10/11/2012   Nerve pain 10/11/2012    Past Surgical History:  Procedure Laterality Date   GUILLAIN-BARRE SYNDROME         History reviewed. No pertinent family history.  Social History   Tobacco Use   Smoking status: Former   Smokeless tobacco: Never  Substance Use Topics   Alcohol use: Yes    Home Medications Prior to Admission medications   Medication Sig Start Date End Date Taking? Authorizing Provider  cephALEXin (KEFLEX) 500 MG capsule Take 1 capsule (500 mg total) by mouth 2 (two) times daily. 11/22/17   Hedges, Tinnie Gens, PA-C  clonazePAM (KLONOPIN) 1 MG tablet Take 1 mg by mouth at bedtime as needed for anxiety.    [provider]  diltiazem (TIAZAC) 180 MG 24 hr capsule Take 180 mg by mouth daily.    [provider]  fentaNYL (DURAGESIC - DOSED MCG/HR) 75 MCG/HR Place 1 patch onto the skin every 3 (three)  days.    [provider]  gabapentin (NEURONTIN) 800 MG tablet Take 800 mg by mouth 4 (four) times daily.    [provider]  HYDROcodone-acetaminophen (NORCO) 10-325 MG per tablet Take 1 tablet by mouth every 6 (six) hours as needed.    [provider]  nortriptyline (PAMELOR) 25 MG capsule Take 25 mg by mouth at bedtime. 1-2 PO QHS    [provider]  pantoprazole (PROTONIX) 20 MG tablet Take 20 mg by mouth as needed.    [provider]  simvastatin (ZOCOR) 20 MG tablet Take 20 mg by mouth every evening.    [provider]    Allergies    Mirtazapine, Nortriptyline hcl, and Vancomycin  Review of Systems   Review of Systems  All other systems reviewed and are negative.  Physical Exam Updated Vital Signs BP (!) 171/89   Pulse 84   Temp 98.1 F (36.7 C) (Oral)   Resp 18   Ht 5\' 7"  (1.702 m)   Wt 77.1 kg   SpO2 98%   BMI 26.63 kg/m   Physical Exam Vitals and nursing note reviewed.  Constitutional:      General: He is not in acute distress.    Appearance: Normal  appearance. He is not ill-appearing.  HENT:     Head: Normocephalic and atraumatic.  Pulmonary:     Effort: Pulmonary effort is normal.  Musculoskeletal:     Comments: The left hand has generalized swelling.  There is some ecchymosis to the palmar surface.  He is able to flex and extend his fingers and tendons seem intact.  Capillary refill is brisk and sensation is intact to the fingertips although he does report some pre-existing nerve damage from his Guillain-Barr  Skin:    General: Skin is warm and dry.  Neurological:     Mental Status: He is alert.    ED Results / Procedures / Treatments   Labs (all labs ordered are listed, but only abnormal results are displayed) Labs Reviewed - No data to display  EKG None  Radiology No results found.  Procedures Procedures   Medications Ordered in ED Medications - No data to display  ED Course  I have  reviewed the triage vital signs and the nursing notes.  Pertinent labs & imaging results that were available during my care of the patient were reviewed by me and considered in my medical decision making (see chart for details).    MDM Rules/Calculators/A&P  Patient's x-rays are negative for fracture per my interpretation.  There has been a delay by radiology on having films read this evening and patient cannot wait until the study is finalized.  Patient counseled about signs and symptoms of possible compartment syndrome.  He will be discharged with elevation, ice, and return as needed if symptoms worsen.  Final Clinical Impression(s) / ED Diagnoses Final diagnoses:  None    Rx / DC Orders ED Discharge Orders     None        Geoffery Lyons, MD 02/02/21 [phone]

## 2021-02-02 NOTE — ED Triage Notes (Addendum)
Pt sustained crush injury to left hand at work when it got pulled through a roller machine. Pt has ice on hand currently.

## 2021-02-10 DIAGNOSIS — G4700 Insomnia, unspecified: Secondary | ICD-10-CM | POA: Diagnosis not present

## 2021-02-10 DIAGNOSIS — G609 Hereditary and idiopathic neuropathy, unspecified: Secondary | ICD-10-CM | POA: Diagnosis not present

## 2021-02-10 DIAGNOSIS — I10 Essential (primary) hypertension: Secondary | ICD-10-CM | POA: Diagnosis not present

## 2021-02-10 DIAGNOSIS — M21371 Foot drop, right foot: Secondary | ICD-10-CM | POA: Diagnosis not present

## 2021-02-10 DIAGNOSIS — E559 Vitamin D deficiency, unspecified: Secondary | ICD-10-CM | POA: Diagnosis not present

## 2021-02-10 DIAGNOSIS — Z720 Tobacco use: Secondary | ICD-10-CM | POA: Diagnosis not present

## 2021-02-10 DIAGNOSIS — S6722XA Crushing injury of left hand, initial encounter: Secondary | ICD-10-CM | POA: Diagnosis not present

## 2021-02-10 DIAGNOSIS — M21372 Foot drop, left foot: Secondary | ICD-10-CM | POA: Diagnosis not present

## 2021-02-10 DIAGNOSIS — G8220 Paraplegia, unspecified: Secondary | ICD-10-CM | POA: Diagnosis not present

## 2021-02-10 DIAGNOSIS — E782 Mixed hyperlipidemia: Secondary | ICD-10-CM | POA: Diagnosis not present

## 2021-02-21 DIAGNOSIS — G4700 Insomnia, unspecified: Secondary | ICD-10-CM | POA: Diagnosis not present

## 2021-02-21 DIAGNOSIS — I10 Essential (primary) hypertension: Secondary | ICD-10-CM | POA: Diagnosis not present

## 2021-02-21 DIAGNOSIS — K219 Gastro-esophageal reflux disease without esophagitis: Secondary | ICD-10-CM | POA: Diagnosis not present

## 2021-02-21 DIAGNOSIS — E782 Mixed hyperlipidemia: Secondary | ICD-10-CM | POA: Diagnosis not present

## 2021-02-21 DIAGNOSIS — G8929 Other chronic pain: Secondary | ICD-10-CM | POA: Diagnosis not present

## 2021-03-11 DIAGNOSIS — G629 Polyneuropathy, unspecified: Secondary | ICD-10-CM | POA: Diagnosis not present

## 2021-03-11 DIAGNOSIS — I10 Essential (primary) hypertension: Secondary | ICD-10-CM | POA: Diagnosis not present

## 2021-03-11 DIAGNOSIS — I951 Orthostatic hypotension: Secondary | ICD-10-CM | POA: Diagnosis not present

## 2021-03-11 DIAGNOSIS — R69 Illness, unspecified: Secondary | ICD-10-CM | POA: Diagnosis not present

## 2021-03-11 DIAGNOSIS — G8220 Paraplegia, unspecified: Secondary | ICD-10-CM | POA: Diagnosis not present

## 2021-03-11 DIAGNOSIS — I2510 Atherosclerotic heart disease of native coronary artery without angina pectoris: Secondary | ICD-10-CM | POA: Diagnosis not present

## 2021-03-11 DIAGNOSIS — E785 Hyperlipidemia, unspecified: Secondary | ICD-10-CM | POA: Diagnosis not present

## 2021-03-11 DIAGNOSIS — G4700 Insomnia, unspecified: Secondary | ICD-10-CM | POA: Diagnosis not present

## 2021-03-11 DIAGNOSIS — Z008 Encounter for other general examination: Secondary | ICD-10-CM | POA: Diagnosis not present

## 2021-03-11 DIAGNOSIS — G8929 Other chronic pain: Secondary | ICD-10-CM | POA: Diagnosis not present

## 2021-05-12 DIAGNOSIS — E559 Vitamin D deficiency, unspecified: Secondary | ICD-10-CM | POA: Diagnosis not present

## 2021-05-12 DIAGNOSIS — M21371 Foot drop, right foot: Secondary | ICD-10-CM | POA: Diagnosis not present

## 2021-05-12 DIAGNOSIS — G8929 Other chronic pain: Secondary | ICD-10-CM | POA: Diagnosis not present

## 2021-05-12 DIAGNOSIS — S6722XA Crushing injury of left hand, initial encounter: Secondary | ICD-10-CM | POA: Diagnosis not present

## 2021-05-12 DIAGNOSIS — M21372 Foot drop, left foot: Secondary | ICD-10-CM | POA: Diagnosis not present

## 2021-05-12 DIAGNOSIS — I10 Essential (primary) hypertension: Secondary | ICD-10-CM | POA: Diagnosis not present

## 2021-05-12 DIAGNOSIS — E782 Mixed hyperlipidemia: Secondary | ICD-10-CM | POA: Diagnosis not present

## 2021-05-12 DIAGNOSIS — G4700 Insomnia, unspecified: Secondary | ICD-10-CM | POA: Diagnosis not present

## 2021-05-12 DIAGNOSIS — G609 Hereditary and idiopathic neuropathy, unspecified: Secondary | ICD-10-CM | POA: Diagnosis not present

## 2021-05-12 DIAGNOSIS — Z720 Tobacco use: Secondary | ICD-10-CM | POA: Diagnosis not present

## 2021-08-15 DIAGNOSIS — E559 Vitamin D deficiency, unspecified: Secondary | ICD-10-CM | POA: Diagnosis not present

## 2021-08-15 DIAGNOSIS — M21372 Foot drop, left foot: Secondary | ICD-10-CM | POA: Diagnosis not present

## 2021-08-15 DIAGNOSIS — Z0001 Encounter for general adult medical examination with abnormal findings: Secondary | ICD-10-CM | POA: Diagnosis not present

## 2021-08-15 DIAGNOSIS — K219 Gastro-esophageal reflux disease without esophagitis: Secondary | ICD-10-CM | POA: Diagnosis not present

## 2021-08-15 DIAGNOSIS — E782 Mixed hyperlipidemia: Secondary | ICD-10-CM | POA: Diagnosis not present

## 2021-08-15 DIAGNOSIS — Z125 Encounter for screening for malignant neoplasm of prostate: Secondary | ICD-10-CM | POA: Diagnosis not present

## 2021-08-15 DIAGNOSIS — L84 Corns and callosities: Secondary | ICD-10-CM | POA: Diagnosis not present

## 2021-08-15 DIAGNOSIS — G8929 Other chronic pain: Secondary | ICD-10-CM | POA: Diagnosis not present

## 2021-08-15 DIAGNOSIS — G4700 Insomnia, unspecified: Secondary | ICD-10-CM | POA: Diagnosis not present

## 2021-08-15 DIAGNOSIS — I10 Essential (primary) hypertension: Secondary | ICD-10-CM | POA: Diagnosis not present

## 2021-08-15 DIAGNOSIS — G609 Hereditary and idiopathic neuropathy, unspecified: Secondary | ICD-10-CM | POA: Diagnosis not present

## 2021-11-10 DIAGNOSIS — G8929 Other chronic pain: Secondary | ICD-10-CM | POA: Diagnosis not present

## 2021-11-10 DIAGNOSIS — M21371 Foot drop, right foot: Secondary | ICD-10-CM | POA: Diagnosis not present

## 2021-11-10 DIAGNOSIS — M21372 Foot drop, left foot: Secondary | ICD-10-CM | POA: Diagnosis not present

## 2021-11-10 DIAGNOSIS — Z720 Tobacco use: Secondary | ICD-10-CM | POA: Diagnosis not present

## 2021-12-01 ENCOUNTER — Encounter: Payer: Self-pay | Admitting: Internal Medicine

## 2021-12-08 DIAGNOSIS — G4700 Insomnia, unspecified: Secondary | ICD-10-CM | POA: Diagnosis not present

## 2021-12-08 DIAGNOSIS — E782 Mixed hyperlipidemia: Secondary | ICD-10-CM | POA: Diagnosis not present

## 2021-12-08 DIAGNOSIS — G8929 Other chronic pain: Secondary | ICD-10-CM | POA: Diagnosis not present

## 2021-12-08 DIAGNOSIS — I10 Essential (primary) hypertension: Secondary | ICD-10-CM | POA: Diagnosis not present

## 2021-12-08 DIAGNOSIS — K219 Gastro-esophageal reflux disease without esophagitis: Secondary | ICD-10-CM | POA: Diagnosis not present

## 2022-02-09 DIAGNOSIS — M21372 Foot drop, left foot: Secondary | ICD-10-CM | POA: Diagnosis not present

## 2022-02-09 DIAGNOSIS — Z720 Tobacco use: Secondary | ICD-10-CM | POA: Diagnosis not present

## 2022-02-09 DIAGNOSIS — G8929 Other chronic pain: Secondary | ICD-10-CM | POA: Diagnosis not present

## 2022-02-09 DIAGNOSIS — M21371 Foot drop, right foot: Secondary | ICD-10-CM | POA: Diagnosis not present

## 2022-03-08 ENCOUNTER — Encounter: Payer: Medicare HMO | Attending: Physical Medicine & Rehabilitation | Admitting: Physical Medicine & Rehabilitation

## 2022-03-08 ENCOUNTER — Encounter: Payer: Self-pay | Admitting: Physical Medicine & Rehabilitation

## 2022-03-08 VITALS — BP 150/90 | HR 79 | Ht 67.0 in | Wt 156.0 lb

## 2022-03-08 DIAGNOSIS — G610 Guillain-Barre syndrome: Secondary | ICD-10-CM | POA: Insufficient documentation

## 2022-03-08 DIAGNOSIS — M792 Neuralgia and neuritis, unspecified: Secondary | ICD-10-CM | POA: Insufficient documentation

## 2022-03-08 DIAGNOSIS — Z79891 Long term (current) use of opiate analgesic: Secondary | ICD-10-CM | POA: Insufficient documentation

## 2022-03-08 DIAGNOSIS — G479 Sleep disorder, unspecified: Secondary | ICD-10-CM | POA: Diagnosis not present

## 2022-03-08 DIAGNOSIS — Z5181 Encounter for therapeutic drug level monitoring: Secondary | ICD-10-CM | POA: Diagnosis not present

## 2022-03-08 DIAGNOSIS — G894 Chronic pain syndrome: Secondary | ICD-10-CM | POA: Insufficient documentation

## 2022-03-08 MED ORDER — IMIPRAMINE HCL 25 MG PO TABS: 25.0000 mg | ORAL_TABLET | Freq: Every day | ORAL | 2 refills | Status: AC

## 2022-03-08 MED ORDER — IMIPRAMINE HCL 25 MG PO TABS: 25.0000 mg | ORAL_TABLET | Freq: Every day | ORAL | 3 refills | Status: DC

## 2022-03-08 NOTE — Progress Notes (Unsigned)
Subjective:    Patient ID: Zachary Mcbride, male    DOB: 09/24/1965, 56 y.o.   MRN: [id number]  HPI  This is an "initial" visit for Zachary Mcbride who I last saw 5+ years ago for deficits related to his GBS. He has been on long term fentanyl for his pain in addition to hydrocodone prn. The fentanyl is and hydrocodone 10/325 4-6 x per day. He is taking gabapentin 800mg  4 x daily. He stopped pamelor d/t palpitations. His PCP Dr. is prescribing his meds but is semi-retiring.  He had been on 100 mcg of fentanyl previously but he decreased the dose per his own request.  He has not tried going down any further.  He definitely notices when he misses a gabapentin dose or tries to decrease the dose of this.  He has not been on Cymbalta or Lyrica nor has he tried any other tricyclic antidepressants.  He has numbness and dysesthesias from his ankles to his toes, sometimes his calves, and in his hands are numb from wrist down. His hands aren't as painful.  Cold temperatures make sx worse.   Sleep is an issue. He has difficulty falling asleep. Sometimes his sleep can be broken. Pain doesn't typically keep him up.  He reports his mood is generally positive.  He tries to stay as active as he can.  He has worn numerous AFOs since his initial diagnosis.  Apparently he went through several carbon braces which continued to break at the ankle so a new type of brace was constructed about a year or so ago which did appears to be a thermoplastic version of his prior carbon braces.  However he reports that the brace rubs up constantly against his ankle as well as the sides and bottom of his foot causing breakdown and calluses.    Pain Inventory Average Pain 8 Pain Right Now 8 My pain is constant, burning, stabbing, and tingling  In the last 24 hours, has pain interfered with the following? General activity 5 Relation with others 0 Enjoyment of life 7 What TIME of day is your pain at its worst?  daytime Sleep (in general) Fair  Pain is worse with: walking, standing, and some activites Pain improves with: medication Relief from Meds: 8  walk without assistance how many minutes can you walk? 15-20 ability to climb steps?  yes do you drive?  yes Do you have any goals in this area?  no  retired  numbness tingling dizziness depression anxiety  Any changes since last visit?  no  Any changes since last visit?  no    History reviewed. No pertinent family history. Social History   Socioeconomic History   Marital status: Married    Spouse name: Not on file   Number of children: Not on file   Years of education: Not on file   Highest education level: Not on file  Occupational History   Not on file  Tobacco Use   Smoking status: Former   Smokeless tobacco: Never  Vaping Use   Vaping Use: Never used  Substance and Sexual Activity   Alcohol use: Yes   Drug use: Not on file   Sexual activity: Not on file  Other Topics Concern   Not on file  Social History Narrative   Not on file   Social Determinants of Health   Financial Resource Strain: Not on file  Food Insecurity: Not on file  Transportation Needs: Not on file  Physical Activity:  Not on file  Stress: Not on file  Social Connections: Not on file   Past Surgical History:  Procedure Laterality Date   GUILLAIN-BARRE SYNDROME     Past Medical History:  Diagnosis Date   Anxiety    Depression    Numbness of extremity    Tingling    Trouble walking    Weakness    BP (!) 150/90   Pulse 79   Ht 5\' 7"  (1.702 m)   Wt 156 lb (70.8 kg)   SpO2 95%   BMI 24.43 kg/m   Opioid Risk Score:   Fall Risk Score:  `1  Depression screen [facility] 2/9     03/08/2022   10:55 AM  Depression screen PHQ 2/9  Decreased Interest 1  Down, Depressed, Hopeless 1  PHQ - 2 Score 2  Altered sleeping 2  Tired, decreased energy 3  Change in appetite 0  Feeling bad or failure about yourself  1  Trouble concentrating 0   Moving slowly or fidgety/restless 0  Suicidal thoughts 0  PHQ-9 Score 8     Review of Systems  Constitutional: Negative.   HENT: Negative.    Eyes: Negative.   Respiratory:  Positive for shortness of breath.   Cardiovascular: Negative.   Gastrointestinal: Negative.   Endocrine: Negative.   Genitourinary: Negative.   Musculoskeletal: Negative.   Skin: Negative.   Allergic/Immunologic: Negative.   Neurological:  Positive for dizziness and numbness.  Hematological: Negative.   Psychiatric/Behavioral:  Positive for dysphoric mood. The patient is nervous/anxious.       Objective:   Physical Exam Gen: no distress, normal appearing HEENT: oral mucosa pink and moist, NCAT Cardio: Reg rate Chest: normal effort, normal rate of breathing Abd: soft, non-distended Ext: no edema Psych: pleasant, normal affect Skin: intact Neuro: Alert and oriented x 3. Normal insight and awareness. Intact Memory. Normal language and speech. Cranial nerve exam unremarkable. UE 5/5 prox to 4/5 distally in hands.  Lower extremities are 4+ to 5 out of 5 at the hips and knees.  He has perhaps 1+ to 2 out of 5 plantarflexion on the right and 1 out of 5 plantarflexion on the left.  0 ankle dorsiflexion on either leg.  He has decreased sensation below both ankles and wrists bilaterally, the feet are much more affected.  There is some chronic signs of wasting in the intrinsics of the feet and to a lesser extent his hands. Musculoskeletal: AFO's. Tight against lateral malleoli.  There are some calluses present along the lateral aspects of the feet.  Having his worn out significantly along the ankle extension portion of the brace as well as the foot plate.  He does walk fairly well with the braces with reasonable heel strike and swing.  He seems to be stable on his feet with these braces.       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    PLAN:  1.  Begin trial of imipramine 25 mg at bedtime to help with neuropathic pain as well as his sleep.  He also may consider trying melatonin 5 mg nightly.  I would not start this until he first is on the imipramine for a week or 2.  -Consider Lyrica and/or Cymbalta trial 2. Will assume narcotics for patient that Dr. 4/9 is retiring. Pt was always consistent and compliant with treatment recs.  3. CSA signed today.  He has enough narcotics  from his primary to last through the end of October. 4. Will send him to [facility] about his AFO's. He's having breakdown due to the positioning of the thermoplastic bracing as well as due to wear and lack of padding.  He is not eligible for new AFOs at this point but they should be able to make some adjustments to help him out.   I will see the patient back in October.. 45 minutes of face to face patient care time were spent during this visit. All questions were encouraged and answered.

## 2022-03-08 NOTE — Patient Instructions (Addendum)
PLEASE FEEL FREE TO CALL OUR OFFICE WITH ANY PROBLEMS OR QUESTIONS [phone])    MELATONIN 5MG  AT BEDTIME, MAY TAKE UP TO 10MG ----

## 2022-03-12 LAB — DRUG TOX MONITOR 1 W/CONF, ORAL FLD
Amphetamines: NEGATIVE ng/mL (ref <10)
Barbiturates: NEGATIVE ng/mL (ref <10)
Benzodiazepines: NEGATIVE ng/mL (ref <0.50)
Benzoylecgonine: >250 ng/mL — ABNORMAL HIGH (ref <5.0)
Buprenorphine: NEGATIVE ng/mL (ref <0.10)
Cocaine: 178 ng/mL — ABNORMAL HIGH (ref <5.0)
Cocaine: POSITIVE ng/mL — AB (ref <5.0)
Cotinine: 47 ng/mL — ABNORMAL HIGH (ref <5.0)
Fentanyl: 9.75 ng/mL — ABNORMAL HIGH (ref <0.10)
Fentanyl: POSITIVE ng/mL — AB (ref <0.10)
Heroin Metabolite: NEGATIVE ng/mL (ref <1.0)
MARIJUANA: NEGATIVE ng/mL (ref <2.5)
MDMA: NEGATIVE ng/mL (ref <10)
Meprobamate: NEGATIVE ng/mL (ref <2.5)
Methadone: NEGATIVE ng/mL (ref <5.0)
Nicotine Metabolite: POSITIVE ng/mL — AB (ref <5.0)
Opiates: NEGATIVE ng/mL (ref <2.5)
Phencyclidine: NEGATIVE ng/mL (ref <10)
Tapentadol: NEGATIVE ng/mL (ref <5.0)
Tramadol: NEGATIVE ng/mL (ref <5.0)
Zolpidem: NEGATIVE ng/mL (ref <5.0)

## 2022-03-12 LAB — DRUG TOX ALC METAB W/CON, ORAL FLD: Alcohol Metabolite: NEGATIVE ng/mL (ref <25)

## 2022-03-14 ENCOUNTER — Encounter: Payer: Self-pay | Admitting: *Deleted

## 2022-03-14 ENCOUNTER — Telehealth: Payer: Self-pay | Admitting: *Deleted

## 2022-03-14 NOTE — Telephone Encounter (Signed)
Oral drug screen for this encounter is positive for Cocaine and its metabolite Benzoylecgonine as well as prescribed fentanyl. A letter was mailed informing him that we will not prescribe narcotics for him per Dr Riley Kill.

## 2022-04-26 ENCOUNTER — Encounter: Payer: Medicare HMO | Admitting: Physical Medicine & Rehabilitation

## 2022-06-05 DIAGNOSIS — M21379 Foot drop, unspecified foot: Secondary | ICD-10-CM | POA: Diagnosis not present

## 2022-06-05 DIAGNOSIS — G4700 Insomnia, unspecified: Secondary | ICD-10-CM | POA: Diagnosis not present

## 2022-06-05 DIAGNOSIS — Z1159 Encounter for screening for other viral diseases: Secondary | ICD-10-CM | POA: Diagnosis not present

## 2022-06-05 DIAGNOSIS — F17200 Nicotine dependence, unspecified, uncomplicated: Secondary | ICD-10-CM | POA: Diagnosis not present

## 2022-06-05 DIAGNOSIS — E785 Hyperlipidemia, unspecified: Secondary | ICD-10-CM | POA: Diagnosis not present

## 2022-06-05 DIAGNOSIS — R69 Illness, unspecified: Secondary | ICD-10-CM | POA: Diagnosis not present

## 2022-06-05 DIAGNOSIS — Z125 Encounter for screening for malignant neoplasm of prostate: Secondary | ICD-10-CM | POA: Diagnosis not present

## 2022-06-05 DIAGNOSIS — Z79899 Other long term (current) drug therapy: Secondary | ICD-10-CM | POA: Diagnosis not present

## 2022-06-05 DIAGNOSIS — E559 Vitamin D deficiency, unspecified: Secondary | ICD-10-CM | POA: Diagnosis not present

## 2022-06-05 DIAGNOSIS — G610 Guillain-Barre syndrome: Secondary | ICD-10-CM | POA: Diagnosis not present

## 2022-06-05 DIAGNOSIS — Z113 Encounter for screening for infections with a predominantly sexual mode of transmission: Secondary | ICD-10-CM | POA: Diagnosis not present

## 2022-07-01 DIAGNOSIS — M129 Arthropathy, unspecified: Secondary | ICD-10-CM | POA: Diagnosis not present

## 2022-07-01 DIAGNOSIS — M545 Low back pain, unspecified: Secondary | ICD-10-CM | POA: Diagnosis not present

## 2022-07-01 DIAGNOSIS — R69 Illness, unspecified: Secondary | ICD-10-CM | POA: Diagnosis not present

## 2022-07-01 DIAGNOSIS — Z79899 Other long term (current) drug therapy: Secondary | ICD-10-CM | POA: Diagnosis not present

## 2022-12-27 ENCOUNTER — Other Ambulatory Visit: Payer: Self-pay

## 2022-12-27 ENCOUNTER — Emergency Department (HOSPITAL_BASED_OUTPATIENT_CLINIC_OR_DEPARTMENT_OTHER): Payer: Medicare HMO | Admitting: Radiology

## 2022-12-27 ENCOUNTER — Emergency Department (HOSPITAL_BASED_OUTPATIENT_CLINIC_OR_DEPARTMENT_OTHER)
Admission: EM | Admit: 2022-12-27 | Discharge: 2022-12-28 | Disposition: A | Discharge status: Home / Self Care | Payer: Medicare HMO | Attending: Emergency Medicine | Admitting: Emergency Medicine

## 2022-12-27 ENCOUNTER — Encounter (HOSPITAL_BASED_OUTPATIENT_CLINIC_OR_DEPARTMENT_OTHER): Payer: Self-pay

## 2022-12-27 DIAGNOSIS — S63502A Unspecified sprain of left wrist, initial encounter: Secondary | ICD-10-CM | POA: Diagnosis not present

## 2022-12-27 DIAGNOSIS — S82891A Other fracture of right lower leg, initial encounter for closed fracture: Secondary | ICD-10-CM | POA: Diagnosis not present

## 2022-12-27 DIAGNOSIS — S99911A Unspecified injury of right ankle, initial encounter: Secondary | ICD-10-CM | POA: Diagnosis present
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
